# Patient Record
Sex: Female | Born: 1965 | Race: White | Hispanic: No | Marital: Married | Smoking: Never smoker
Health system: Southern US, Community
[De-identification: ages and names within clinical notes are randomized; demographics above are authoritative.]

## PROBLEM LIST (undated history)

## (undated) DIAGNOSIS — T7840XA Allergy, unspecified, initial encounter: Secondary | ICD-10-CM

## (undated) DIAGNOSIS — C50419 Malignant neoplasm of upper-outer quadrant of unspecified female breast: Secondary | ICD-10-CM

## (undated) DIAGNOSIS — F329 Major depressive disorder, single episode, unspecified: Secondary | ICD-10-CM

## (undated) DIAGNOSIS — F419 Anxiety disorder, unspecified: Secondary | ICD-10-CM

## (undated) DIAGNOSIS — F32A Depression, unspecified: Secondary | ICD-10-CM

## (undated) HISTORY — PX: ENDOMETRIAL ABLATION: SHX621

## (undated) HISTORY — DX: Major depressive disorder, single episode, unspecified: F32.9

## (undated) HISTORY — DX: Malignant neoplasm of upper-outer quadrant of unspecified female breast: C50.419

## (undated) HISTORY — DX: Allergy, unspecified, initial encounter: T78.40XA

## (undated) HISTORY — PX: OTHER SURGICAL HISTORY: SHX169

## (undated) HISTORY — DX: Anxiety disorder, unspecified: F41.9

## (undated) HISTORY — DX: Depression, unspecified: F32.A

---

## 1992-03-04 HISTORY — PX: TUBAL LIGATION: SHX77

## 1999-02-08 ENCOUNTER — Other Ambulatory Visit: Admission: RE | Admit: 1999-02-08 | Discharge: 1999-02-08 | Payer: Self-pay | Admitting: *Deleted

## 2000-01-18 ENCOUNTER — Other Ambulatory Visit: Admission: RE | Admit: 2000-01-18 | Discharge: 2000-01-18 | Payer: Self-pay | Admitting: *Deleted

## 2000-12-19 ENCOUNTER — Other Ambulatory Visit: Admission: RE | Admit: 2000-12-19 | Discharge: 2000-12-19 | Payer: Self-pay | Admitting: *Deleted

## 2001-01-02 ENCOUNTER — Other Ambulatory Visit: Admission: RE | Admit: 2001-01-02 | Discharge: 2001-01-02 | Payer: Self-pay | Admitting: *Deleted

## 2001-07-30 ENCOUNTER — Other Ambulatory Visit: Admission: RE | Admit: 2001-07-30 | Discharge: 2001-07-30 | Payer: Self-pay | Admitting: *Deleted

## 2002-08-20 ENCOUNTER — Other Ambulatory Visit: Admission: RE | Admit: 2002-08-20 | Discharge: 2002-08-20 | Payer: Self-pay | Admitting: *Deleted

## 2003-07-15 ENCOUNTER — Other Ambulatory Visit: Admission: RE | Admit: 2003-07-15 | Discharge: 2003-07-15 | Payer: Self-pay | Admitting: *Deleted

## 2004-08-01 ENCOUNTER — Emergency Department: Payer: Self-pay | Admitting: Emergency Medicine

## 2006-12-02 ENCOUNTER — Ambulatory Visit: Payer: Self-pay | Admitting: Family Medicine

## 2007-12-17 ENCOUNTER — Ambulatory Visit: Payer: Self-pay | Admitting: Family Medicine

## 2008-06-09 ENCOUNTER — Ambulatory Visit: Payer: Self-pay | Admitting: Family Medicine

## 2009-05-31 ENCOUNTER — Ambulatory Visit: Payer: Self-pay | Admitting: Unknown Physician Specialty

## 2009-06-07 ENCOUNTER — Ambulatory Visit: Payer: Self-pay | Admitting: Unknown Physician Specialty

## 2010-03-04 HISTORY — PX: OTHER SURGICAL HISTORY: SHX169

## 2010-06-05 ENCOUNTER — Ambulatory Visit: Payer: Self-pay | Admitting: Unknown Physician Specialty

## 2011-11-03 HISTORY — PX: NASAL SINUS SURGERY: SHX719

## 2011-11-28 ENCOUNTER — Ambulatory Visit: Payer: Self-pay | Admitting: Otolaryngology

## 2012-02-02 HISTORY — PX: ABDOMINAL HYSTERECTOMY: SHX81

## 2012-02-04 ENCOUNTER — Ambulatory Visit: Payer: Self-pay | Admitting: Obstetrics and Gynecology

## 2012-02-04 LAB — BASIC METABOLIC PANEL
Anion Gap: 5 — ABNORMAL LOW (ref 7–16)
BUN: 12 mg/dL (ref 7–18)
Creatinine: 0.86 mg/dL (ref 0.60–1.30)
EGFR (African American): >60
Osmolality: 275 (ref 275–301)

## 2012-02-04 LAB — CBC
MCHC: 33.5 g/dL (ref 32.0–36.0)
RDW: 13.5 % (ref 11.5–14.5)
WBC: 6.2 10*3/uL (ref 3.6–11.0)

## 2012-02-14 ENCOUNTER — Ambulatory Visit: Payer: Self-pay | Admitting: Obstetrics and Gynecology

## 2012-02-15 LAB — BASIC METABOLIC PANEL
BUN: 11 mg/dL (ref 7–18)
Calcium, Total: 8.9 mg/dL (ref 8.5–10.1)
EGFR (African American): >60
EGFR (Non-African Amer.): >60
Glucose: 137 mg/dL — ABNORMAL HIGH (ref 65–99)
Osmolality: 283 (ref 275–301)

## 2012-02-15 LAB — HEMATOCRIT: HCT: 37.3 % (ref 35.0–47.0)

## 2012-08-12 ENCOUNTER — Ambulatory Visit: Payer: Self-pay | Admitting: Obstetrics and Gynecology

## 2013-02-11 ENCOUNTER — Ambulatory Visit: Payer: Self-pay | Admitting: Obstetrics and Gynecology

## 2013-03-04 HISTORY — PX: BREAST LUMPECTOMY: SHX2

## 2013-08-06 ENCOUNTER — Ambulatory Visit: Payer: Self-pay | Admitting: Obstetrics and Gynecology

## 2013-08-11 ENCOUNTER — Ambulatory Visit (INDEPENDENT_AMBULATORY_CARE_PROVIDER_SITE_OTHER): Payer: 59 | Admitting: General Surgery

## 2013-08-11 ENCOUNTER — Encounter: Payer: Self-pay | Admitting: General Surgery

## 2013-08-11 VITALS — BP 118/76 | HR 74 | Resp 12 | Ht 63.0 in | Wt 170.0 lb

## 2013-08-11 DIAGNOSIS — R920 Mammographic microcalcification found on diagnostic imaging of breast: Secondary | ICD-10-CM

## 2013-08-11 NOTE — Progress Notes (Signed)
Patient ID: Megan Bass, female   DOB: August 15, 1965, 48 y.o.   MRN: [id number]  Chief Complaint  Patient presents with  . Other    mammogram    HPI Megan Bass is a 48 y.o. female who presents for a breast evaluation. The most recent mammogram was done on 08/06/13. Patient does perform regular self breast checks and gets regular mammograms done. The patient denies any new problems with the breasts at this time. She has had no prior breast surgeries and no known family history of breast problems. Patient states she has been followed every 6 months for a left breast tenderness. No injuries to the breasts.   HPI  Past Medical History  Diagnosis Date  . Allergy   . Anxiety     Past Surgical History  Procedure Laterality Date  . Abdominal hysterectomy  02/2012  . Nasal sinus surgery  11/2011  . Ingrown toe nail  2012    History reviewed. No pertinent family history.  Social History History  Substance Use Topics  . Smoking status: Never Smoker   . Smokeless tobacco: Not on file  . Alcohol Use: No    Allergies  Allergen Reactions  . Iodides Itching  . Sulfa Antibiotics Rash    Current Outpatient Prescriptions  Medication Sig Dispense Refill  . Ascorbic Acid (VITAMIN C) 100 MG tablet Take 100 mg by mouth daily.      . cetirizine (ZYRTEC) 10 MG tablet Take 10 mg by mouth daily.      . cholecalciferol (VITAMIN D) 1000 UNITS tablet Take 1,000 Units by mouth daily.      . citalopram (CELEXA) 20 MG tablet Take 1 tablet by mouth daily.      . mometasone (NASONEX) 50 MCG/ACT nasal spray Place 2 sprays into the nose daily.       No current facility-administered medications for this visit.    Review of Systems Review of Systems  Constitutional: Negative.   Respiratory: Negative.   Cardiovascular: Negative.     Blood pressure 118/76, pulse 74, resp. rate 12, height 5\' 3"  (1.6 m), weight 170 lb (77.111 kg).  Physical Exam Physical Exam  Constitutional: She is  oriented to person, place, and time. She appears well-developed and well-nourished.  Neck: Neck supple. No thyromegaly present.  Cardiovascular: Normal rate, regular rhythm and normal heart sounds.   Pulmonary/Chest: Effort normal and breath sounds normal. Right breast exhibits no inverted nipple, no mass, no nipple discharge, no skin change and no tenderness. Left breast exhibits no inverted nipple, no mass, no nipple discharge, no skin change and no tenderness.  Lymphadenopathy:    She has no cervical adenopathy.    She has no axillary adenopathy.  Neurological: She is alert and oriented to person, place, and time.  Skin: Skin is warm and dry.    Data Reviewed 08/06/2013 mammograms and left breast ultrasound were reviewed. 1 symmetric asymmetry in the left breast, unchanged over the past year. Ultrasound showed multiple cysts.  ) Showed a new area of microcalcifications in the 9-10:00 position measuring 5 mm in maximum diameter. Calcium/fibrocystic calcifications were noted throughout the upper-outer quadrant.  BI-RADS 4 exam based on the new cluster of microcalcifications in the right breast.  Stable left breast cysts.  Assessment    Interval change in the right breast mammogram with new cluster of microcalcifications. No history of trauma.    Plan    Options for management include six-month followup versus early stereotactic biopsy. Pros and cons of both  approaches were reviewed. The patient desires to proceed to biopsy.    Patient has been scheduled for a Right breast stereotactic biopsy at [facility] for 08/23/13 at 1 PM. She will check-in at the [facility] at 12:30 PM. This patient is aware of date, time, and instructions. Patient verbalizes understanding.  PCP: Margarita Rana Ref. Dr. Sandi Mariscal, [place] Gleason 08/12/2013, 7:12 AM

## 2013-08-11 NOTE — Patient Instructions (Addendum)
Patient to be scheduled for right breast stereotactic biopsy.  The patient is aware to call back for any questions or concerns.  Stereotactic Breast Biopsy  A stereotactic breast biopsy is a procedure in which mammography is used in the collection of a sample of breast tissue. Mammography is a type of X-ray exam of the breasts that produces an image called a mammogram. The mammogram allows your health care provider to precisely locate the area of the breast from which a tissue sample will be taken. The tissue is then examined under a microscope to see if cancerous cells are present. A breast biopsy is done when:   A lump, abnormality, or mass is seen in the breast on a breast X-ray (mammogram).   Small calcium deposits (calcifications) are seen in the breast.   The shape or appearance of the breasts changes.   The shape or appearance of the nipples changes. You may have unusual or bloody discharge coming from the nipples, or you may have crusting, retraction, or dimpling of the nipples. A breast biopsy can indicate if you need surgery or other treatment.  LET [facility] CARE PROVIDER KNOW ABOUT:  Any allergies you have.  All medicines you are taking, including vitamins, herbs, eye drops, creams, and over-the-counter medicines.  Previous problems you or members of your family have had with the use of anesthetics.  Any blood disorders you have.  Previous surgeries you have had.  Medical conditions you have. RISKS AND COMPLICATIONS Generally, stereotactic breast biopsy is a safe procedure. However, as with any procedure, complications can occur. Possible complications include:  Infection at the needle-insertion site.   Bleeding or bruising after surgery.  The breast may become altered or deformed as a result of the procedure.  The needle may go through the chest wall into the lung area.  BEFORE THE PROCEDURE  Wear a supportive bra to the procedure.  You will be asked to  remove jewelry, dentures, eyeglasses, metal objects, or clothing that might interfere with the X-ray images. You may want to leave some of these objects at home.  Arrange for someone to drive you home after the procedure if desired. PROCEDURE  A stereotactic breast biopsy is done while you are awake. During the procedure, relax as much as possible. Let your health care provider know if you are uncomfortable, anxious, or in pain. Usually, the only discomfort felt during the procedure is caused by staying in one position for the length of the procedure. This discomfort can be reduced by carefully placed cushions. Most of the time the biopsy is done using a table with openings on it. You will be asked to lie facedown on the table and place your breasts through the openings. Your breast is compressed between metal plates to get good X-ray images. Your skin will be cleaned, and a numbing medicine (local anesthetic) will be injected. A small cut (incision) will be made in your breast. The tip of the biopsy needle will be directed through the incision. Several small pieces of suspicious tissue will be taken. Then, a final set of X-ray images will be obtained. If they show that the suspicious tissue has been mostly or completely removed, a small clip will be left at the biopsy site. This is done so that the biopsy site can be easily located if the results of the biopsy show that the tissue is cancerous.  After the procedure, the incision will be stitched (sutured) or taped and covered with a bandage (dressing).  Your health care provider may apply a pressure dressing and an ice pack to prevent bleeding and swelling in the breast.  A stereotactic breast biopsy can take 30 minutes or more. AFTER THE PROCEDURE  If you are doing well and have no problems, you will be allowed to go home.  Document Released: 11/17/2002 Document Revised: 10/21/2012 Document Reviewed: 09/17/2012 [facility] Patient Information 2014  [place], [place].  Patient has been scheduled for a Right breast stereotactic biopsy at [facility] for 08/23/13 at 1 PM. [facility] will [facility] at [facility] at 12:30 PM. This patient is aware of date, time, and instructions. Patient verbalizes understanding.

## 2013-08-12 DIAGNOSIS — R920 Mammographic microcalcification found on diagnostic imaging of breast: Secondary | ICD-10-CM | POA: Insufficient documentation

## 2013-08-23 ENCOUNTER — Ambulatory Visit: Payer: Self-pay | Admitting: General Surgery

## 2013-08-23 DIAGNOSIS — R920 Mammographic microcalcification found on diagnostic imaging of breast: Secondary | ICD-10-CM

## 2013-08-23 HISTORY — PX: BREAST BIOPSY: SHX20

## 2013-08-25 ENCOUNTER — Telehealth: Payer: Self-pay | Admitting: General Surgery

## 2013-08-25 LAB — PATHOLOGY REPORT

## 2013-08-25 NOTE — Telephone Encounter (Signed)
Notified of biopsy results from the right breast stereo biopsy completed on August 23, 2013. With the findings of LCIS, bilateral MRI will be indicated to exclude secondary lesions. Offered OV to review, but patient wants to wait until MRI completed.

## 2013-08-26 ENCOUNTER — Telehealth: Payer: Self-pay | Admitting: *Deleted

## 2013-08-26 ENCOUNTER — Encounter: Payer: Self-pay | Admitting: General Surgery

## 2013-08-26 ENCOUNTER — Telehealth: Payer: Self-pay | Admitting: General Surgery

## 2013-08-26 DIAGNOSIS — C50919 Malignant neoplasm of unspecified site of unspecified female breast: Secondary | ICD-10-CM

## 2013-08-26 NOTE — Telephone Encounter (Signed)
Patient to come in for discussion on 09-02-13 at 4:30 pm. She is aware of instructions and verbalizes understanding.

## 2013-08-26 NOTE — Telephone Encounter (Signed)
Message copied by Dominga Ferry on Thu Aug 26, 2013  9:13 AM ------      Message from: [place], Forest Gleason      Created: Wed Aug 25, 2013  5:51 PM       Please schedule a bilateral breast MRI in [place] re: LCIS of the right breast. Thanks. Appt to follow 1-2 days after the study.  ------

## 2013-08-26 NOTE — Telephone Encounter (Signed)
Message left for patient on cell and work number for patient to call the office.   The [facility] has scheduled patient for an MRI on 09-01-13. We need to see if patient can come in for an office discussion on 09-02-13 with Dr. Bary Castilla.

## 2013-08-26 NOTE — Telephone Encounter (Signed)
I wanted the patient to be aware that LCIS is not breast cancer in the event it was discussed at the time of her MRI.

## 2013-08-26 NOTE — Telephone Encounter (Signed)
Message left for Megan Bass at the [facility] that patient needs bilateral breast MRI.

## 2013-08-30 ENCOUNTER — Ambulatory Visit: Payer: 59

## 2013-09-01 ENCOUNTER — Ambulatory Visit
Admission: RE | Admit: 2013-09-01 | Discharge: 2013-09-01 | Disposition: A | Discharge status: Home / Self Care | Payer: 59 | Source: Ambulatory Visit | Attending: General Surgery | Admitting: General Surgery

## 2013-09-01 DIAGNOSIS — C50919 Malignant neoplasm of unspecified site of unspecified female breast: Secondary | ICD-10-CM

## 2013-09-01 MED ORDER — GADOBENATE DIMEGLUMINE 529 MG/ML IV SOLN
15.0000 mL | Freq: Once | INTRAVENOUS | Status: AC | PRN
  Administered 2013-09-01: 15 mL via INTRAVENOUS

## 2013-09-02 ENCOUNTER — Encounter: Payer: Self-pay | Admitting: General Surgery

## 2013-09-02 ENCOUNTER — Ambulatory Visit (INDEPENDENT_AMBULATORY_CARE_PROVIDER_SITE_OTHER): Payer: 59 | Admitting: General Surgery

## 2013-09-02 VITALS — BP 124/76 | HR 76 | Resp 12 | Ht 63.0 in | Wt 164.0 lb

## 2013-09-02 DIAGNOSIS — C50919 Malignant neoplasm of unspecified site of unspecified female breast: Secondary | ICD-10-CM

## 2013-09-02 DIAGNOSIS — D0590 Unspecified type of carcinoma in situ of unspecified breast: Secondary | ICD-10-CM

## 2013-09-02 DIAGNOSIS — D0501 Lobular carcinoma in situ of right breast: Secondary | ICD-10-CM

## 2013-09-02 DIAGNOSIS — D0500 Lobular carcinoma in situ of unspecified breast: Secondary | ICD-10-CM | POA: Insufficient documentation

## 2013-09-02 NOTE — Progress Notes (Signed)
Patient ID: Megan Bass, female   DOB: 1965/11/09, 48 y.o.   MRN: [id number]  Chief Complaint  Patient presents with  . Follow-up    discuss MRI     HPI Megan Bass is a 48 y.o. female here today for her follow up MRI done 09/01/13. Patient here today for follow up post right  breast biopsy.  Aware of pathology. The patient is accompanied by her husband as well as her daughter Megan Bass. HPI  Past Medical History  Diagnosis Date  . Allergy   . Anxiety     Past Surgical History  Procedure Laterality Date  . Abdominal hysterectomy  02/2012  . Nasal sinus surgery  11/2011  . Ingrown toe nail  2012    No family history on file.  Social History History  Substance Use Topics  . Smoking status: Never Smoker   . Smokeless tobacco: Not on file  . Alcohol Use: No    Allergies  Allergen Reactions  . Iodides Itching  . Sulfa Antibiotics Rash    Current Outpatient Prescriptions  Medication Sig Dispense Refill  . Ascorbic Acid (VITAMIN C) 100 MG tablet Take 100 mg by mouth daily.      . cetirizine (ZYRTEC) 10 MG tablet Take 10 mg by mouth daily.      . cholecalciferol (VITAMIN D) 1000 UNITS tablet Take 1,000 Units by mouth daily.      . citalopram (CELEXA) 20 MG tablet Take 1 tablet by mouth daily.      . mometasone (NASONEX) 50 MCG/ACT nasal spray Place 2 sprays into the nose daily.       No current facility-administered medications for this visit.    Review of Systems Review of Systems  Constitutional: Negative.   Respiratory: Negative.   Cardiovascular: Negative.     Blood pressure 124/76, pulse 76, resp. rate 12, height 5\' 3"  (1.6 m), weight 164 lb (74.39 kg).  Physical Exam Physical Exam  Data Reviewed Pathology on the right breast biopsy specimen showed evidence of LCIS as well as ADH.  Her recently completed bilateral breast MRI showed the left breast the unremarkable. The right breast had a focal area of uptake in the lower inner quadrant that  will require MRI guided biopsy.  Assessment    LCIS right breast. Focal abnormality on right breast MRI.     Plan    The patient had previously been notified that LCIS as a marker for the potential void and disease in either breast, but in of itself does not require additional treatment. As ADH was also identified in the breast specimen from her recent stereotactic biopsy, reexcision of this area warranted. This will be postponed until after the MRI guided biopsy of the lower inner quadrant right breast abnormality is completed.    Arrangements will be made for the patient to have an MR guided biopsy of the right breast at [facility].     PCP: Etheleen Mayhew 09/02/2013, 8:04 PM

## 2013-09-02 NOTE — Patient Instructions (Signed)
Arrangements will be made for the patient to have an MR guided biopsy of the right breast at [place].

## 2013-09-06 ENCOUNTER — Telehealth: Payer: Self-pay | Admitting: *Deleted

## 2013-09-06 ENCOUNTER — Other Ambulatory Visit: Payer: Self-pay | Admitting: General Surgery

## 2013-09-06 DIAGNOSIS — C50919 Malignant neoplasm of unspecified site of unspecified female breast: Secondary | ICD-10-CM

## 2013-09-06 NOTE — Telephone Encounter (Signed)
Patient has been scheduled for an MR guided biopsy of the right breast for 09-10-13 at [facility].

## 2013-09-10 ENCOUNTER — Encounter (INDEPENDENT_AMBULATORY_CARE_PROVIDER_SITE_OTHER): Payer: Self-pay

## 2013-09-10 ENCOUNTER — Ambulatory Visit
Admission: RE | Admit: 2013-09-10 | Discharge: 2013-09-10 | Disposition: A | Discharge status: Home / Self Care | Payer: 59 | Source: Ambulatory Visit | Attending: General Surgery | Admitting: General Surgery

## 2013-09-10 DIAGNOSIS — C50919 Malignant neoplasm of unspecified site of unspecified female breast: Secondary | ICD-10-CM

## 2013-09-10 HISTORY — PX: BREAST BIOPSY: SHX20

## 2013-09-10 MED ORDER — GADOBENATE DIMEGLUMINE 529 MG/ML IV SOLN
15.0000 mL | Freq: Once | INTRAVENOUS | Status: AC | PRN
  Administered 2013-09-10: 15 mL via INTRAVENOUS

## 2013-09-13 ENCOUNTER — Telehealth: Payer: Self-pay | Admitting: General Surgery

## 2013-09-13 ENCOUNTER — Telehealth: Payer: Self-pay | Admitting: *Deleted

## 2013-09-13 NOTE — Telephone Encounter (Signed)
Reviewed MRI biopsy results of July 10th. (Benign). Reports bleeding post bx day 1.  No hematoma, some bruising.  Plan for wide excision of ADH/ LCIS reviewed. Will arrange OV prior to confirm area remains visible on ultrasound.

## 2013-09-13 NOTE — Telephone Encounter (Signed)
Patient's surgery has been scheduled for 10-14-13 at [facility]. This patient will come to the office for a pre-op visit on 10-06-13 at 4 pm. She is aware of all instructions.

## 2013-10-06 ENCOUNTER — Ambulatory Visit (INDEPENDENT_AMBULATORY_CARE_PROVIDER_SITE_OTHER): Payer: 59 | Admitting: General Surgery

## 2013-10-06 ENCOUNTER — Other Ambulatory Visit: Payer: 59

## 2013-10-06 ENCOUNTER — Encounter: Payer: Self-pay | Admitting: General Surgery

## 2013-10-06 VITALS — BP 130/78 | HR 66 | Resp 12 | Ht 63.0 in | Wt 159.0 lb

## 2013-10-06 DIAGNOSIS — D0501 Lobular carcinoma in situ of right breast: Secondary | ICD-10-CM

## 2013-10-06 DIAGNOSIS — N6089 Other benign mammary dysplasias of unspecified breast: Secondary | ICD-10-CM

## 2013-10-06 DIAGNOSIS — N6091 Unspecified benign mammary dysplasia of right breast: Secondary | ICD-10-CM

## 2013-10-06 DIAGNOSIS — D0590 Unspecified type of carcinoma in situ of unspecified breast: Secondary | ICD-10-CM

## 2013-10-06 DIAGNOSIS — R920 Mammographic microcalcification found on diagnostic imaging of breast: Secondary | ICD-10-CM

## 2013-10-06 NOTE — Patient Instructions (Addendum)
Continue self breast exams. Call office for any new breast issues or concerns.  Patient's surgery has been scheduled for 10-14-13 at [facility].

## 2013-10-06 NOTE — Progress Notes (Signed)
Patient ID: Megan Bass, female   DOB: 08/22/1965, 48 y.o.   MRN: [id number]  Chief Complaint  Patient presents with  . Pre-op Exam    right breast wide excision for 10-14-13    HPI Megan Bass is a 48 y.o. female.  Here today for her preop appointment, right breast wide excision for 10-14-13. No new complaints.   HPI  Past Medical History  Diagnosis Date  . Allergy   . Anxiety     Past Surgical History  Procedure Laterality Date  . Abdominal hysterectomy  02/2012  . Nasal sinus surgery  11/2011  . Ingrown toe nail  2012    No family history on file.  Social History History  Substance Use Topics  . Smoking status: Never Smoker   . Smokeless tobacco: Not on file  . Alcohol Use: No    Allergies  Allergen Reactions  . Iodides Itching  . Sulfa Antibiotics Rash    Current Outpatient Prescriptions  Medication Sig Dispense Refill  . Ascorbic Acid (VITAMIN C) 100 MG tablet Take 100 mg by mouth daily.      . cetirizine (ZYRTEC) 10 MG tablet Take 10 mg by mouth daily.      . cholecalciferol (VITAMIN D) 1000 UNITS tablet Take 1,000 Units by mouth daily.      . citalopram (CELEXA) 20 MG tablet Take 1 tablet by mouth daily.      . mometasone (NASONEX) 50 MCG/ACT nasal spray Place 2 sprays into the nose daily.       No current facility-administered medications for this visit.    Review of Systems Review of Systems  Constitutional: Negative.   Respiratory: Negative.   Cardiovascular: Negative.     Blood pressure 130/78, pulse 66, resp. rate 12, height 5\' 3"  (1.6 m), weight 159 lb (72.122 kg).  Physical Exam Physical Exam  Constitutional: She is oriented to person, place, and time. She appears well-developed and well-nourished.  Neck: Neck supple.  Cardiovascular: Normal rate, regular rhythm and normal heart sounds.   Pulmonary/Chest: Effort normal and breath sounds normal. Right breast exhibits no inverted nipple, no mass, no nipple discharge, no skin  change and no tenderness. Left breast exhibits no inverted nipple, no mass, no nipple discharge, no skin change and no tenderness.  Lymphadenopathy:    She has no cervical adenopathy.    She has no axillary adenopathy.  Neurological: She is alert and oriented to person, place, and time.  Skin: Skin is warm and dry.    Data Reviewed Ultrasound examination of the right breast was completed to determine if the biopsy cavity was evident. This was not the case. She will require needle localization. No images.  Assessment    LCIS, ADH right breast.    Plan    We'll arrange for a wire localization wide excision of the right breast. 2 determine if invasive lobular carcinoma or DCIS is present.  Patient's surgery has been scheduled for 10-14-13 at [facility].     PCP: Margarita Rana Dr. Clayburn Pert  Robert Bellow 10/08/2013, 8:49 AM

## 2013-10-07 ENCOUNTER — Telehealth: Payer: Self-pay | Admitting: *Deleted

## 2013-10-07 NOTE — Telephone Encounter (Signed)
Message left on cell and work numbers for patient to call the office.   Patient will need to arrive at the [facility] day of surgery 10-14-13 at 8:20 am.

## 2013-10-07 NOTE — Telephone Encounter (Signed)
Patient aware of instructions and verbalizes understanding.

## 2013-10-08 ENCOUNTER — Other Ambulatory Visit: Payer: Self-pay | Admitting: General Surgery

## 2013-10-08 DIAGNOSIS — N6091 Unspecified benign mammary dysplasia of right breast: Secondary | ICD-10-CM | POA: Insufficient documentation

## 2013-10-08 DIAGNOSIS — D0501 Lobular carcinoma in situ of right breast: Secondary | ICD-10-CM

## 2013-10-14 ENCOUNTER — Ambulatory Visit: Payer: Self-pay | Admitting: General Surgery

## 2013-10-14 ENCOUNTER — Encounter: Payer: Self-pay | Admitting: General Surgery

## 2013-10-14 DIAGNOSIS — D4860 Neoplasm of uncertain behavior of unspecified breast: Secondary | ICD-10-CM

## 2013-10-14 DIAGNOSIS — C50419 Malignant neoplasm of upper-outer quadrant of unspecified female breast: Secondary | ICD-10-CM

## 2013-10-14 HISTORY — PX: BREAST SURGERY: SHX581

## 2013-10-14 HISTORY — DX: Malignant neoplasm of upper-outer quadrant of unspecified female breast: C50.419

## 2013-10-14 HISTORY — PX: BREAST EXCISIONAL BIOPSY: SUR124

## 2013-10-18 ENCOUNTER — Telehealth: Payer: Self-pay | Admitting: General Surgery

## 2013-10-18 NOTE — Telephone Encounter (Signed)
Notified path only showed LCIS. No upstaging to invasive cancer.  Back at work. Minimal pain well controlled w/ oral meds.  F/U as scheduled on August 20th to discuss further treatment.

## 2013-10-19 ENCOUNTER — Encounter: Payer: Self-pay | Admitting: General Surgery

## 2013-10-21 ENCOUNTER — Encounter: Payer: Self-pay | Admitting: General Surgery

## 2013-10-21 ENCOUNTER — Ambulatory Visit (INDEPENDENT_AMBULATORY_CARE_PROVIDER_SITE_OTHER): Payer: Self-pay | Admitting: General Surgery

## 2013-10-21 VITALS — BP 126/78 | HR 68 | Resp 12 | Ht 63.0 in | Wt 157.0 lb

## 2013-10-21 DIAGNOSIS — D0501 Lobular carcinoma in situ of right breast: Secondary | ICD-10-CM

## 2013-10-21 DIAGNOSIS — D0590 Unspecified type of carcinoma in situ of unspecified breast: Secondary | ICD-10-CM

## 2013-10-21 MED ORDER — TAMOXIFEN CITRATE 20 MG PO TABS: 20.0000 mg | ORAL_TABLET | Freq: Every day | ORAL | Status: DC

## 2013-10-21 NOTE — Patient Instructions (Addendum)
Patient to return one month . Patient to take  Aspirin 81 and calcium 1200 mg daily. Patient to let us know how she is dealing with the tamoxifen at the time of your next visit.

## 2013-10-21 NOTE — Progress Notes (Signed)
Patient ID: Megan Bass, female   DOB: 10/03/1965, 48 y.o.   MRN: [id number]  Chief Complaint  Patient presents with  . Routine Post Op    right breast lumpectomy    HPI Megan Bass is a 48 y.o. female  Here today for her post op right breast lumpectomy done on 10/14/13. Patient states she is doing well.    HPI  Past Medical History  Diagnosis Date  . Allergy   . Anxiety   . Malignant neoplasm of upper-outer quadrant of female breast October 14, 2013    right breast excision ADH./LCIS.     Past Surgical History  Procedure Laterality Date  . Abdominal hysterectomy  02/2012  . Nasal sinus surgery  11/2011  . Ingrown toe nail  2012  . Breast surgery Right 10/14/13    lumpectomy    No family history on file.  Social History History  Substance Use Topics  . Smoking status: Never Smoker   . Smokeless tobacco: Not on file  . Alcohol Use: No    Allergies  Allergen Reactions  . Iodides Itching  . Sulfa Antibiotics Rash    Current Outpatient Prescriptions  Medication Sig Dispense Refill  . Ascorbic Acid (VITAMIN C) 100 MG tablet Take 100 mg by mouth daily.      . cetirizine (ZYRTEC) 10 MG tablet Take 10 mg by mouth daily.      . cholecalciferol (VITAMIN D) 1000 UNITS tablet Take 1,000 Units by mouth daily.      . citalopram (CELEXA) 20 MG tablet Take 1 tablet by mouth daily.      Marland Kitchen HYDROcodone-acetaminophen (NORCO/VICODIN) 5-325 MG per tablet       . mometasone (NASONEX) 50 MCG/ACT nasal spray Place 2 sprays into the nose daily.      . tamoxifen (NOLVADEX) 20 MG tablet Take 1 tablet (20 mg total) by mouth daily.  30 tablet  12   No current facility-administered medications for this visit.    Review of Systems Review of Systems  Constitutional: Negative.   Respiratory: Negative.   Cardiovascular: Negative.     Blood pressure 126/78, pulse 68, resp. rate 12, height 5\' 3"  (1.6 m), weight 157 lb (71.215 kg).  Physical Exam Physical Exam   Constitutional: She is oriented to person, place, and time. She appears well-developed and well-nourished.  Eyes: Conjunctivae are normal.  Neck: Neck supple.  Cardiovascular: Normal rate, regular rhythm and normal heart sounds.   Pulmonary/Chest: Effort normal and breath sounds normal.  Left breast lumpectomy site looks clean and healing well.   Neurological: She is alert and oriented to person, place, and time.  Skin: Skin is warm and dry.    Data Reviewed Biopsies showed ADH. LCIS with negative margins.  Previous MRI unremarkable and the contralateral breast.  Assessment    LCIS/ADH of the right breast.     Plan    The patient is at increased risk for malignancy. Baker Janus model estimate colon 5 years 2%, lifetime 17% risk.  The patient reports her insurance company denied her initial MRI in spite of a biopsy showing LCIS and the high likelihood of multifocal disease. A letter will be sent.  The role for chemoprevention with tamoxifen was discussed. Primary side effects including increased risk of DVT and vasomotor symptoms reviewed. At this time the patient is amenable to a one-month trial. The importance of staying with the medication for one full month anticipating any profound vasomotor symptoms developing in weeks 2-3 will  resolve by that time.  Followup examination in one month.    PCP: Etheleen Mayhew 10/22/2013, 9:21 PM

## 2013-10-22 ENCOUNTER — Encounter: Payer: Self-pay | Admitting: General Surgery

## 2013-10-25 ENCOUNTER — Ambulatory Visit (INDEPENDENT_AMBULATORY_CARE_PROVIDER_SITE_OTHER): Payer: Self-pay | Admitting: *Deleted

## 2013-10-25 VITALS — Ht 63.0 in | Wt 157.0 lb

## 2013-10-25 DIAGNOSIS — D0590 Unspecified type of carcinoma in situ of unspecified breast: Secondary | ICD-10-CM

## 2013-10-25 DIAGNOSIS — D0501 Lobular carcinoma in situ of right breast: Secondary | ICD-10-CM

## 2013-10-25 NOTE — Progress Notes (Signed)
This is a 48 year old female here today for redness and itching at right biopsy site.  Patient to use a benadryl cream for the redness.

## 2013-10-25 NOTE — Patient Instructions (Signed)
Patient to return as scheduled.  

## 2013-11-22 ENCOUNTER — Encounter: Payer: Self-pay | Admitting: General Surgery

## 2013-11-22 ENCOUNTER — Ambulatory Visit (INDEPENDENT_AMBULATORY_CARE_PROVIDER_SITE_OTHER): Payer: Self-pay | Admitting: General Surgery

## 2013-11-22 VITALS — BP 130/76 | HR 78 | Resp 12 | Ht 63.0 in | Wt 159.0 lb

## 2013-11-22 DIAGNOSIS — D0501 Lobular carcinoma in situ of right breast: Secondary | ICD-10-CM

## 2013-11-22 DIAGNOSIS — D0590 Unspecified type of carcinoma in situ of unspecified breast: Secondary | ICD-10-CM

## 2013-11-22 NOTE — Progress Notes (Signed)
Patient ID: Megan Bass, female   DOB: April 23, 1965, 48 y.o.   MRN: [id number]  Chief Complaint  Patient presents with  . Follow-up    1 month f/u lcis    HPI Megan Bass is a 48 y.o. female who presents for a 1 month follow up of LCIS. The patient underwent left breast lumpectomy on 10/14/13.  The patient reports that the hot flashes since institution of tamoxifen therapy has significantly improved.  HPI  Past Medical History  Diagnosis Date  . Allergy   . Anxiety   . Malignant neoplasm of upper-outer quadrant of female breast October 14, 2013    Right breast excision ADH./LCIS.     Past Surgical History  Procedure Laterality Date  . Abdominal hysterectomy  02/2012  . Nasal sinus surgery  11/2011  . Ingrown toe nail  2012  . Breast surgery Right 10/14/13    lumpectomy    History reviewed. No pertinent family history.  Social History History  Substance Use Topics  . Smoking status: Never Smoker   . Smokeless tobacco: Not on file  . Alcohol Use: No    Allergies  Allergen Reactions  . Iodides Itching  . Sulfa Antibiotics Rash    Current Outpatient Prescriptions  Medication Sig Dispense Refill  . Ascorbic Acid (VITAMIN C) 100 MG tablet Take 100 mg by mouth daily.      Marland Kitchen aspirin 81 MG tablet Take 81 mg by mouth daily.      . Calcium Carbonate-Vit D-Min (CALCIUM 1200 PO) Take 1 tablet by mouth daily.      . cetirizine (ZYRTEC) 10 MG tablet Take 10 mg by mouth daily.      . cholecalciferol (VITAMIN D) 1000 UNITS tablet Take 1,000 Units by mouth daily.      . citalopram (CELEXA) 20 MG tablet Take 1 tablet by mouth daily.      Marland Kitchen HYDROcodone-acetaminophen (NORCO/VICODIN) 5-325 MG per tablet       . mometasone (NASONEX) 50 MCG/ACT nasal spray Place 2 sprays into the nose daily.      . tamoxifen (NOLVADEX) 20 MG tablet Take 1 tablet (20 mg total) by mouth daily.  30 tablet  12   No current facility-administered medications for this visit.    Review of  Systems Review of Systems  Blood pressure 130/76, pulse 78, resp. rate 12, height 5\' 3"  (1.6 m), weight 159 lb (72.122 kg).  Physical Exam Physical Exam  Constitutional: She is oriented to person, place, and time. She appears well-developed.  Eyes: Conjunctivae are normal. No scleral icterus.  Cardiovascular: Normal rate, regular rhythm and normal heart sounds.   Pulmonary/Chest: Effort normal and breath sounds normal.  Right breast incision looks clean and healing well./   Neurological: She is alert and oriented to person, place, and time.  Skin: Skin is warm and dry.    Data Reviewed No new data. ER/PR data not available, requested.  Assessment    LCIS, Reasonable tolerance of antiestrogen therapy.     Plan    Will plan for a followup examination 5 months and repeat mammograms in Middle/late summer 2016.     PCP: Margarita Rana  Ref Dr. Clayburn Pert    Robert Bellow 11/22/2013, 9:02 PM

## 2013-11-22 NOTE — Patient Instructions (Signed)
Patient to return in five months 

## 2013-11-26 LAB — PATHOLOGY REPORT

## 2014-01-03 ENCOUNTER — Encounter: Payer: Self-pay | Admitting: General Surgery

## 2014-01-31 ENCOUNTER — Telehealth: Payer: Self-pay

## 2014-01-31 NOTE — Telephone Encounter (Signed)
Patient called and said that she is now having a lot of problems with night sweats. She reports going thru two changes of clothes per night. She spoke with her primary care doctor who suggested either Black Cohosh or Bellergal-S supplements to help with this. She advised her to ask you first about supplements or if you had any other suggestions.

## 2014-02-03 NOTE — Telephone Encounter (Signed)
Discussed night sweats worse, daytime hot flashes tolerable. Getting up BID/TID to change clothes w/ Tamoxifen. Has an RX for compounding of Bellergal S.  Will try for one month, if no better, will change to black cohash. To call here if no improvement with either meds to review advisability of continued chemoprevention.

## 2014-04-26 ENCOUNTER — Ambulatory Visit: Payer: Self-pay | Admitting: General Surgery

## 2014-04-28 ENCOUNTER — Ambulatory Visit: Payer: Self-pay | Admitting: General Surgery

## 2014-05-04 ENCOUNTER — Ambulatory Visit (INDEPENDENT_AMBULATORY_CARE_PROVIDER_SITE_OTHER): Payer: 59 | Admitting: General Surgery

## 2014-05-04 ENCOUNTER — Encounter: Payer: Self-pay | Admitting: General Surgery

## 2014-05-04 VITALS — BP 120/80 | HR 80 | Resp 14 | Ht 63.0 in | Wt 155.0 lb

## 2014-05-04 DIAGNOSIS — M940 Chondrocostal junction syndrome [Tietze]: Secondary | ICD-10-CM

## 2014-05-04 DIAGNOSIS — D0501 Lobular carcinoma in situ of right breast: Secondary | ICD-10-CM

## 2014-05-04 NOTE — Progress Notes (Signed)
Patient ID: Megan Bass, female   DOB: 24-Aug-1965, 49 y.o.   MRN: [id number]  Chief Complaint  Patient presents with  . Follow-up    mammogram    HPI Megan Bass is a 49 y.o. female who presents for a breast cancer follow up. No mammogram at this time. She does states that yesterday she felt like she had a lump in the upper inner quadrant of the left breast. She states she was having pain in this area. Ergotamine has significantly improved 8 sweats.  HPI  Past Medical History  Diagnosis Date  . Allergy   . Anxiety   . Malignant neoplasm of upper-outer quadrant of female breast October 14, 2013    Right breast excision ADH./LCIS.     Past Surgical History  Procedure Laterality Date  . Abdominal hysterectomy  02/2012  . Nasal sinus surgery  11/2011  . Ingrown toe nail  2012  . Breast surgery Right 10/14/13    lumpectomy    History reviewed. No pertinent family history.  Social History History  Substance Use Topics  . Smoking status: Never Smoker   . Smokeless tobacco: Not on file  . Alcohol Use: No    Allergies  Allergen Reactions  . Iodides Itching  . Sulfa Antibiotics Rash    Current Outpatient Prescriptions  Medication Sig Dispense Refill  . Ascorbic Acid (VITAMIN C) 100 MG tablet Take 100 mg by mouth daily.    Marland Kitchen aspirin 81 MG tablet Take 81 mg by mouth daily.    . Calcium Carbonate-Vit D-Min (CALCIUM 1200 PO) Take 1 tablet by mouth daily.    . cetirizine (ZYRTEC) 10 MG tablet Take 10 mg by mouth daily.    . cholecalciferol (VITAMIN D) 1000 UNITS tablet Take 1,000 Units by mouth daily.    . citalopram (CELEXA) 20 MG tablet Take 1 tablet by mouth daily.    . Ergotamine Tartrate POWD Take 1 tablet by mouth 2 (two) times daily.    . mometasone (NASONEX) 50 MCG/ACT nasal spray Place 2 sprays into the nose daily.    . tamoxifen (NOLVADEX) 20 MG tablet Take 1 tablet (20 mg total) by mouth daily. 30 tablet 12   No current facility-administered  medications for this visit.    Review of Systems Review of Systems  Constitutional: Negative.   Respiratory: Negative.   Cardiovascular: Negative.     Blood pressure 120/80, pulse 80, resp. rate 14, height 5\' 3"  (1.6 m), weight 155 lb (70.308 kg).  Physical Exam Physical Exam  Constitutional: She is oriented to person, place, and time. She appears well-developed and well-nourished.  Neck: Neck supple. No thyromegaly present.  Cardiovascular: Normal rate, regular rhythm and normal heart sounds.   No murmur heard. Pulmonary/Chest: Effort normal. Right breast exhibits no inverted nipple, no mass, no nipple discharge, no skin change and no tenderness. Left breast exhibits no inverted nipple, no mass, no nipple discharge, no skin change and no tenderness.    Well healed incision in right upper outer quadrant.   Lymphadenopathy:    She has no cervical adenopathy.    She has no axillary adenopathy.  Neurological: She is alert and oriented to person, place, and time.  Skin: Skin is warm and dry.      Assessment    Benign breast exam, mild costochondritis, resolving.    Plan    The patient should continue her regular exercise program. Follow-up examination with bilateral mammograms in 3 months.    PCP:  Margit Hanks W 05/05/2014, 9:21 PM

## 2014-05-04 NOTE — Patient Instructions (Signed)
Patient to return in 3 months for follow up. Continue self breast exams. Call office for any new breast issues or concerns.

## 2014-05-05 ENCOUNTER — Encounter: Payer: Self-pay | Admitting: General Surgery

## 2014-05-05 DIAGNOSIS — M940 Chondrocostal junction syndrome [Tietze]: Secondary | ICD-10-CM | POA: Insufficient documentation

## 2014-06-21 NOTE — Op Note (Signed)
PATIENT NAME:  Megan Bass, MARQUART MR#:  [id number] DATE OF BIRTH:  [birth year]  DATE OF PROCEDURE:  02/14/2012  PREOPERATIVE DIAGNOSIS: Severe pelvic pain with heavy bleeding unresponsive to medical management.   POSTOPERATIVE DIAGNOSIS: Severe pelvic pain with heavy bleeding unresponsive to medical management.   PROCEDURE PERFORMED: Laparoscopic supracervical hysterectomy with bilateral salpingectomy.   SURGEON: Delsa Sale, MD   ASSISTANT: Prentice Docker, MD  ANESTHESIA: General endotracheal anesthesia.   ESTIMATED BLOOD LOSS: Approximately 50 mL.   FINDINGS AT SURGERY: A globular uterus with normal tubes and ovaries bilaterally.   DESCRIPTION OF PROCEDURE: The patient was taken to the operating room and placed in a supine position. After adequate general endotracheal anesthesia was instilled, the patient was prepped and draped in the usual sterile fashion. Timeout was performed. The patient was placed in [place] and the Foley catheter was found to be placed correctly on her leg. A side-opening speculum was placed in the patient's vagina and the anterior lip of the cervix was grasped with a single-tooth tenaculum. The Hulka tenaculum was placed into the uterus and the side-opening speculum was removed from the patient's vagina. The umbilicus was injected with Marcaine. Incision was made with the scalpel. Veress needle was placed. Hang drop test, fluid instillation test and fluid aspiration test showed proper placement of the Veress needle. The CO2 was placed on low flow. When tympany was heard around the liver, CO2 was placed on high flow. The Veress needle was then removed and the XCEL trocars were placed under direct visualization. The CO2 was allowed to flow into the trocar. The aforementioned findings were seen. A right-sided 5 mm trocar port was placed into the lower abdomen and a 10 mm trocar port was placed on the left side. The Harmonic scalpel was placed to the left lower  quadrant and the left tube was grasped from the opposite side. The Harmonic scalpel was then used to cut through the peritoneum and the broad ligament between the uterus and the ovary. This was carried down to the junction of the utero-ovarian ligament which was also clamped, cut and ligated with the Harmonic scalpel. This was completed first on the left and then on the right. In a similar fashion, the round ligaments were grasped bilaterally first on the right, then on the left, grasped, cut, clamped and cauterized. A bladder flap was created with the edge of the Harmonic scalpel. The bladder flap was pushed off the edge of the cervix. The uterine arteries were skeletonized. Kleppinger was used to grasp and cauterize the uterine arteries in 3 places. The Harmonic scalpel was then used to cut across the cauterized area of the uterine arteries. The Harmonic scalpel was then used to cut across the uterocervical junction and the uterus was released. The Kleppinger was then used to cauterize the os of the cervix. There was one small area of bleeding on the left and this was cauterized again with the Kleppinger. The pelvis was irrigated with copious amounts of warm normal saline. The left lower quadrant trocar port was extended with the scalpel and the morcellator trocar was placed into the skin incision. Attention was then turned to the morcellator and the uterus. The uterus was grasped with the tenaculum through the morcellator and the uterus was pulled through the morcellator. The pelvis was then copiously irrigated. The aforementioned findings were seen. The anterior abdominal wall was intact. There was good hemostasis of the ovaries and the cervix. Photographs were taken. Interceed was placed across  the cervical opening. The patient was taken out of Trendelenburg and laid flat. Good hemostasis was identified. The patient had been given indigo carmine after clamping of the uterine arteries. Blue urine was noted in  the Foley bag. The patient was laid supine and the metal cone was used to close the abdominal incision on the left, UR-6 was then used to close the umbilicus, 4-0 Monocryl was used to approximate the skin edges. The Dermabond was placed. Tegaderm was placed. Hulka tenaculum was removed from the patient's vagina. Good hemostasis was identified. The patient was laid supine and taken to recovery after having tolerated the procedure well.     ____________________________ Delsa Sale, MD cck:es D: 02/19/2012 22:44:49 ET T: 02/20/2012 08:54:21 ET JOB#: [id number]  cc: Delsa Sale, MD, <Dictator> Delsa Sale MD ELECTRONICALLY SIGNED 03/02/2012 0:48

## 2014-06-25 NOTE — Op Note (Signed)
PATIENT NAME:  Megan Bass, Megan Bass MR#:  [id number] DATE OF BIRTH:  Dec 15, 1965  DATE OF PROCEDURE:  10/14/2013  PREOPERATIVE DIAGNOSIS: Lobular carcinoma in situ, atypical ductal hyperplasia of the right breast.   POSTOPERATIVE DIAGNOSIS: Lobular carcinoma in situ, atypical ductal hyperplasia of the right breast.   OPERATIVE PROCEDURE: Wire localized wide excision of the right upper outer quadrant of the right breast.   SURGEON: Dr. Hervey Ard   ANESTHESIA: General by LMA under Dr. Kayleen Memos, Marcaine 0.5% with 1:200,000 units of epinephrine local anesthesia.  This is a 49 year old woman recently underwent a stereotactic biopsy of an abnormal mammogram with findings of LCIS and ADH. Preoperative MRI showed no additional areas. She was felt to be a candidate for wide local excision to determine if there would be upstaging to DCIS.   OPERATIVE NOTE: The patient underwent needle localization by Conchita Paris, M.D., from radiology, with positioning of the tip of the Kopans wire adjacent to the previously placed clip. The patient underwent general anesthesia. The breast was taped medially and ultrasound used to identify the tip of the wire and the breast specimen. The curvilinear incision in the upper outer quadrant was made after instillation of local anesthetic. It was incised sharply and the remaining dissection completed with electrocautery. A 4 x 4 x 4 cm block of tissue was excised and specimen radiograph confirmed the intact tip of the wire and the previously placed clip. The wound was closed in multiple layers of 2-0 Vicryl figure-of-eight sutures. Skin was closed with running 4-0 Vicryl subcuticular suture. Benzoin and Steri-Strips followed by Telfa gauze, fluff, Kerlix and an Ace wrap applied.   The patient tolerated the procedure well and was taken to the recovery room in stable condition.     ____________________________ Robert Bellow, MD jwb:JT D: 10/14/2013 22:19:41  ET T: 10/15/2013 01:10:21 ET JOB#: [id number]  cc: Robert Bellow, MD, <Dictator> Jerrell Belfast, MD Sante Biedermann Amedeo Kinsman MD ELECTRONICALLY SIGNED 10/15/2013 16:38

## 2014-07-13 ENCOUNTER — Other Ambulatory Visit: Payer: Self-pay

## 2014-07-13 DIAGNOSIS — D0501 Lobular carcinoma in situ of right breast: Secondary | ICD-10-CM

## 2014-07-20 ENCOUNTER — Other Ambulatory Visit: Payer: Self-pay

## 2014-07-20 DIAGNOSIS — D0501 Lobular carcinoma in situ of right breast: Secondary | ICD-10-CM

## 2014-08-12 ENCOUNTER — Ambulatory Visit: Payer: Self-pay

## 2014-08-12 ENCOUNTER — Other Ambulatory Visit: Payer: Self-pay | Admitting: General Surgery

## 2014-08-12 ENCOUNTER — Ambulatory Visit
Admission: RE | Admit: 2014-08-12 | Discharge: 2014-08-12 | Disposition: A | Discharge status: Home / Self Care | Payer: 59 | Source: Ambulatory Visit | Attending: General Surgery | Admitting: General Surgery

## 2014-08-12 DIAGNOSIS — D0501 Lobular carcinoma in situ of right breast: Secondary | ICD-10-CM

## 2014-08-16 ENCOUNTER — Other Ambulatory Visit: Payer: Self-pay

## 2014-08-16 DIAGNOSIS — N959 Unspecified menopausal and perimenopausal disorder: Secondary | ICD-10-CM | POA: Insufficient documentation

## 2014-08-18 ENCOUNTER — Encounter: Payer: Self-pay | Admitting: General Surgery

## 2014-08-18 ENCOUNTER — Ambulatory Visit (INDEPENDENT_AMBULATORY_CARE_PROVIDER_SITE_OTHER): Payer: 59 | Admitting: General Surgery

## 2014-08-18 VITALS — BP 142/80 | HR 76 | Resp 14 | Ht 63.0 in | Wt 154.0 lb

## 2014-08-18 DIAGNOSIS — D0501 Lobular carcinoma in situ of right breast: Secondary | ICD-10-CM | POA: Diagnosis not present

## 2014-08-18 NOTE — Patient Instructions (Addendum)
Continue self breast exams. Call office for any new breast issues or concerns. May take aspirin every other day

## 2014-08-18 NOTE — Progress Notes (Signed)
Patient ID: Megan Bass, female   DOB: 1966/01/16, 49 y.o.   MRN: [id number]  Chief Complaint  Patient presents with  . Follow-up    mammogram    HPI Megan Bass is a 49 y.o. female who presents for a breast evaluation. The most recent mammogram and left breast ultrasound was done on 08/12/14. Patient does perform regular self breast checks and gets regular mammograms done.  No new breast issues. Tolerating Tamoxifen with minimal hot flashes. The patient does appreciated marked increased in bruisability since beginning aspirin, 81 mg per day prescribed to minimize her risk of DVT related to antiestrogen therapy.   HPI  Past Medical History  Diagnosis Date  . Allergy   . Anxiety   . Malignant neoplasm of upper-outer quadrant of female breast October 14, 2013    Right breast excision ADH./LCIS.     Past Surgical History  Procedure Laterality Date  . Abdominal hysterectomy  02/2012  . Nasal sinus surgery  11/2011  . Ingrown toe nail  2012  . Breast surgery Right 10/14/13    lumpectomy  . Breast biopsy Right 6.22.15    LOBULAR CARCINOMA IN SITU/ATYPICAL DUCTAL HYPERPLASIA     No family history on file.  Social History History  Substance Use Topics  . Smoking status: Never Smoker   . Smokeless tobacco: Not on file  . Alcohol Use: No    Allergies  Allergen Reactions  . Iodides Itching  . Sulfa Antibiotics Rash    Current Outpatient Prescriptions  Medication Sig Dispense Refill  . Ascorbic Acid (VITAMIN C) 100 MG tablet Take 100 mg by mouth daily.    Marland Kitchen aspirin 81 MG tablet Take 81 mg by mouth daily.    . Calcium Carbonate-Vit D-Min (CALCIUM 1200 PO) Take 1 tablet by mouth daily.    . cetirizine (ZYRTEC) 10 MG tablet Take 10 mg by mouth daily.    . cholecalciferol (VITAMIN D) 1000 UNITS tablet Take 1,000 Units by mouth daily.    . citalopram (CELEXA) 20 MG tablet Take 1 tablet by mouth daily.    . Ergotamine Tartrate POWD Take 1 tablet by mouth 2 (two)  times daily.    . mometasone (NASONEX) 50 MCG/ACT nasal spray Place 2 sprays into the nose daily.    . tamoxifen (NOLVADEX) 20 MG tablet Take 1 tablet (20 mg total) by mouth daily. 30 tablet 12   No current facility-administered medications for this visit.    Review of Systems Review of Systems  Constitutional: Negative.   Respiratory: Negative.   Cardiovascular: Negative.     Blood pressure 142/80, pulse 76, resp. rate 14, height 5\' 3"  (1.6 m), weight 154 lb (69.854 kg).  Physical Exam Physical Exam  Constitutional: She is oriented to person, place, and time. She appears well-developed and well-nourished.  Neck: Neck supple.  Cardiovascular: Normal rate, regular rhythm and normal heart sounds.   Pulmonary/Chest: Effort normal and breath sounds normal. Right breast exhibits no inverted nipple, no mass, no nipple discharge, no skin change and no tenderness. Left breast exhibits no inverted nipple, no mass, no nipple discharge, no skin change and no tenderness.  Well healed incision upper outer quadrant right breast. 10 % volume loss right breast.  Lymphadenopathy:    She has no cervical adenopathy.    She has no axillary adenopathy.  Neurological: She is alert and oriented to person, place, and time.  Skin: Skin is warm and dry.    Data Reviewed 08/12/2014 mammograms and  ultrasound reviewed.  BI-RADS 2.  Assessment    Benign breast exam. Past history LCIS.    Plan    The patient will decrease her pediatric aspirin 2 every other day and see if this results in an return to her baseline bruisability.    May take aspirin every other day. Patient to have a bilateral diagnostic mammogram follow up in one year.  Continue self breast exams. Call office for any new breast issues or concerns.  PCP:  Etheleen Mayhew 08/19/2014, 5:22 PM

## 2014-08-18 NOTE — Progress Notes (Deleted)
Patient ID: Megan Bass, female   DOB: Sep 25, 1965, 49 y.o.   MRN: [id number]  Chief Complaint  Patient presents with  . Follow-up    mammogram    HPI Megan Bass is a 49 y.o. female.  *** HPI  Past Medical History  Diagnosis Date  . Allergy   . Anxiety   . Malignant neoplasm of upper-outer quadrant of female breast October 14, 2013    Right breast excision ADH./LCIS.     Past Surgical History  Procedure Laterality Date  . Abdominal hysterectomy  02/2012  . Nasal sinus surgery  11/2011  . Ingrown toe nail  2012  . Breast surgery Right 10/14/13    lumpectomy  . Breast biopsy Right 6.22.15    LOBULAR CARCINOMA IN SITU/ATYPICAL DUCTAL HYPERPLASIA     No family history on file.  Social History History  Substance Use Topics  . Smoking status: Never Smoker   . Smokeless tobacco: Not on file  . Alcohol Use: No    Allergies  Allergen Reactions  . Iodides Itching  . Sulfa Antibiotics Rash    Current Outpatient Prescriptions  Medication Sig Dispense Refill  . Ascorbic Acid (VITAMIN C) 100 MG tablet Take 100 mg by mouth daily.    Marland Kitchen aspirin 81 MG tablet Take 81 mg by mouth daily.    . Calcium Carbonate-Vit D-Min (CALCIUM 1200 PO) Take 1 tablet by mouth daily.    . cetirizine (ZYRTEC) 10 MG tablet Take 10 mg by mouth daily.    . cholecalciferol (VITAMIN D) 1000 UNITS tablet Take 1,000 Units by mouth daily.    . citalopram (CELEXA) 20 MG tablet Take 1 tablet by mouth daily.    . Ergotamine Tartrate POWD Take 1 tablet by mouth 2 (two) times daily.    . mometasone (NASONEX) 50 MCG/ACT nasal spray Place 2 sprays into the nose daily.    . tamoxifen (NOLVADEX) 20 MG tablet Take 1 tablet (20 mg total) by mouth daily. 30 tablet 12   No current facility-administered medications for this visit.    Review of Systems Review of Systems  Constitutional: Negative.   HENT: Negative.   Eyes: Negative.   Respiratory: Negative.   Cardiovascular: Negative.    Gastrointestinal: Negative.   Endocrine: Negative.   Genitourinary: Negative.   Skin: Negative.   Allergic/Immunologic: Negative.   Neurological: Negative.     Blood pressure 142/80, pulse 76, resp. rate 14, height 5\' 3"  (1.6 m), weight 154 lb (69.854 kg).  Physical Exam Physical Exam  Data Reviewed ***  Assessment    ***    Plan    ***       Robert Bellow 08/18/2014, 9:58 AM

## 2014-11-23 ENCOUNTER — Other Ambulatory Visit: Payer: Self-pay | Admitting: General Surgery

## 2015-03-09 ENCOUNTER — Other Ambulatory Visit: Payer: Self-pay

## 2015-06-01 ENCOUNTER — Other Ambulatory Visit: Payer: Self-pay | Admitting: *Deleted

## 2015-06-01 DIAGNOSIS — D0501 Lobular carcinoma in situ of right breast: Secondary | ICD-10-CM

## 2015-06-01 DIAGNOSIS — Z1231 Encounter for screening mammogram for malignant neoplasm of breast: Secondary | ICD-10-CM

## 2015-06-06 ENCOUNTER — Encounter: Payer: Self-pay | Admitting: *Deleted

## 2015-06-12 ENCOUNTER — Other Ambulatory Visit: Payer: Self-pay | Admitting: *Deleted

## 2015-06-12 NOTE — Addendum Note (Signed)
Addended by: Verlene Mayer A on: 06/12/2015 02:22 PM   Modules accepted: Orders

## 2015-08-08 ENCOUNTER — Ambulatory Visit (INDEPENDENT_AMBULATORY_CARE_PROVIDER_SITE_OTHER): Payer: 59 | Admitting: Family Medicine

## 2015-08-08 ENCOUNTER — Ambulatory Visit
Admission: RE | Admit: 2015-08-08 | Discharge: 2015-08-08 | Disposition: A | Discharge status: Home / Self Care | Payer: 59 | Source: Ambulatory Visit | Attending: Family Medicine | Admitting: Family Medicine

## 2015-08-08 ENCOUNTER — Encounter: Payer: Self-pay | Admitting: Family Medicine

## 2015-08-08 VITALS — BP 132/84 | HR 74 | Temp 98.6°F | Resp 16 | Wt 162.6 lb

## 2015-08-08 DIAGNOSIS — M7742 Metatarsalgia, left foot: Secondary | ICD-10-CM

## 2015-08-08 DIAGNOSIS — J019 Acute sinusitis, unspecified: Secondary | ICD-10-CM | POA: Diagnosis not present

## 2015-08-08 MED ORDER — AMOXICILLIN-POT CLAVULANATE 875-125 MG PO TABS: 1.0000 | ORAL_TABLET | Freq: Two times a day (BID) | ORAL | Status: DC

## 2015-08-08 NOTE — Progress Notes (Signed)
Subjective:     Patient ID: Megan Bass, female   DOB: 1965/06/05, 50 y.o.   MRN: [id number]  HPI  Chief Complaint  Patient presents with  . Dysphagia    Patient comes in office today with concerns of difficutly swallowing for the past 24hrs. Patient reports that she has noticed swelling of her right tonsil but denies sore throat, patient admits that she has been dealing with sinus drinage and allergies for thew past week. Patient has been taking otc allergy medication and nasal spray.   . Foot Pain    Patient reports left foot pain for the past 24 hrs, patient states that she is an active walker and now has pain when walking or bearing weight on foot.   States she developed sore throat a week ago and then developed purulent sinus drainage with PND. This has not abated with current dysphagia symptoms. Reports difficulty swallowing both liquids and solids but on choking. Denies hx of heartburn or reflux but has tried a Prilosec for her sx. States she is concerned about a bony foot problem as she is on tamoxifen for breast cancer. Denies specific injury: "It hurt to walk my dogs yesterday."   Review of Systems     Objective:   Physical Exam  Constitutional: She appears well-developed.  Ears: T.M's intact without inflammation Throat: mild right tonsillar enlargement without erythema or exudate. Neck: no cervical adenopathy, thyromegaly or tenderness Extremities: left foot tender over her first mid metatarsal. DF/PF 5/5 without pain. Ankle and foot ligaments stable.     Assessment:    1. Acute sinusitis, recurrence not specified, unspecified location - amoxicillin-clavulanate (AUGMENTIN) 875-125 MG tablet; Take 1 tablet by mouth 2 (two) times daily.  Dispense: 20 tablet; Refill: 0  2. Metatarsalgia of left foot - DG Foot Complete Left; Future    Plan:    Further f/u pending x-ray report.

## 2015-08-08 NOTE — Patient Instructions (Signed)
Add Mucinex D. We will call you with the x-ray result.

## 2015-08-09 ENCOUNTER — Telehealth: Payer: Self-pay

## 2015-08-09 NOTE — Telephone Encounter (Signed)
-----   Message from Carmon Ginsberg, [place] sent at 08/08/2015  9:31 AM EDT ----- X-ray ok. Try two Aleve twice daily with food and cold compresses for 20 minutes 2-3 x day for two days.

## 2015-08-09 NOTE — Telephone Encounter (Signed)
Patient has been advised. KW 

## 2015-08-14 ENCOUNTER — Other Ambulatory Visit: Payer: Self-pay | Admitting: General Surgery

## 2015-08-14 ENCOUNTER — Ambulatory Visit
Admission: RE | Admit: 2015-08-14 | Discharge: 2015-08-14 | Disposition: A | Discharge status: Home / Self Care | Payer: 59 | Source: Ambulatory Visit | Attending: General Surgery | Admitting: General Surgery

## 2015-08-14 DIAGNOSIS — R921 Mammographic calcification found on diagnostic imaging of breast: Secondary | ICD-10-CM | POA: Insufficient documentation

## 2015-08-14 DIAGNOSIS — D0501 Lobular carcinoma in situ of right breast: Secondary | ICD-10-CM

## 2015-08-14 DIAGNOSIS — Z853 Personal history of malignant neoplasm of breast: Secondary | ICD-10-CM | POA: Insufficient documentation

## 2015-08-22 ENCOUNTER — Ambulatory Visit (INDEPENDENT_AMBULATORY_CARE_PROVIDER_SITE_OTHER): Payer: 59 | Admitting: General Surgery

## 2015-08-22 ENCOUNTER — Encounter: Payer: Self-pay | Admitting: General Surgery

## 2015-08-22 VITALS — BP 144/78 | HR 74 | Resp 12 | Ht 63.0 in | Wt 161.0 lb

## 2015-08-22 DIAGNOSIS — N952 Postmenopausal atrophic vaginitis: Secondary | ICD-10-CM

## 2015-08-22 DIAGNOSIS — D0501 Lobular carcinoma in situ of right breast: Secondary | ICD-10-CM | POA: Diagnosis not present

## 2015-08-22 MED ORDER — ESTRADIOL 0.1 MG/GM VA CREA: TOPICAL_CREAM | VAGINAL | Status: DC

## 2015-08-22 NOTE — Patient Instructions (Signed)
The patient has been asked to return to the office in one year with a bilateral diagnostic mammogram. 

## 2015-08-22 NOTE — Progress Notes (Signed)
Patient ID: Megan Bass, female   DOB: 1965/10/28, 50 y.o.   MRN: [id number]  Chief Complaint  Patient presents with  . Follow-up    mammgram    HPI Megan Bass is a 50 y.o. female who presents for a breast evaluation. The most recent mammogram was done on 08/14/15 . Patient does perform regular self breast checks and gets regular mammograms done.Tolerating Tamoxifen with minimal hot flashes   The patient has noted significant difficulty with vaginal dryness and dysparunia unresponsive to topical lubricants. She also recently was treated with a course of antibiotics and had her first vaginal yeast infection and decades.    HPI  Past Medical History  Diagnosis Date  . Allergy   . Anxiety   . Malignant neoplasm of upper-outer quadrant of female breast [facility]) October 14, 2013    Right breast excision ADH./LCIS.     Past Surgical History  Procedure Laterality Date  . Abdominal hysterectomy  02/2012  . Nasal sinus surgery  11/2011  . Ingrown toe nail  2012  . Breast surgery Right 10/14/13    lumpectomy  . Breast biopsy Right 6.22.15    LOBULAR CARCINOMA IN SITU/ATYPICAL DUCTAL HYPERPLASIA     No family history on file.  Social History Social History  Substance Use Topics  . Smoking status: Never Smoker   . Smokeless tobacco: None  . Alcohol Use: No    Allergies  Allergen Reactions  . Iodides Itching  . Sulfa Antibiotics Rash    Current Outpatient Prescriptions  Medication Sig Dispense Refill  . aspirin 81 MG tablet Take 81 mg by mouth daily.    . Calcium Carbonate-Vit D-Min (CALCIUM 1200 PO) Take 1 tablet by mouth daily.    . cetirizine (ZYRTEC) 10 MG tablet Take 10 mg by mouth daily.    . cholecalciferol (VITAMIN D) 1000 UNITS tablet Take 1,000 Units by mouth daily.    . citalopram (CELEXA) 20 MG tablet Take 1 tablet by mouth daily.    . Ergotamine Tartrate POWD Take 1 tablet by mouth 2 (two) times daily.    . Ergotamine-PB-Belladonna (BEL/PHEN/ERGOT SR  PO) Take by mouth.    . mometasone (NASONEX) 50 MCG/ACT nasal spray Place 2 sprays into the nose daily.    . tamoxifen (NOLVADEX) 20 MG tablet Take 1 tablet by mouth  every day 90 tablet 3  . estradiol (ESTRACE VAGINAL) 0.1 MG/GM vaginal cream Apply vaginally one applicator full daily for one week, then twice a week as needed for comfort. 42.5 g 12   No current facility-administered medications for this visit.    Review of Systems Review of Systems  Constitutional: Negative.   Respiratory: Negative.   Cardiovascular: Negative.     Blood pressure 144/78, pulse 74, resp. rate 12, height 5\' 3"  (1.6 m), weight 161 lb (73.029 kg).  Physical Exam Physical Exam  Constitutional: She is oriented to person, place, and time. She appears well-developed and well-nourished.  Eyes: Conjunctivae are normal. No scleral icterus.  Neck: Neck supple.  Cardiovascular: Normal rate, regular rhythm and normal heart sounds.   Pulmonary/Chest: Effort normal and breath sounds normal. Right breast exhibits no inverted nipple, no mass, no nipple discharge, no skin change and no tenderness. Left breast exhibits no inverted nipple, no mass, no nipple discharge, no skin change and no tenderness.    Well healed incision upper outer quadrant right breast. 10 % volume loss right breast.   Abdominal: Soft. Bowel sounds are normal. There is no  tenderness.  Lymphadenopathy:    She has no cervical adenopathy.  Neurological: She is alert and oriented to person, place, and time.  Skin: Skin is warm and dry.    Data Reviewed Bilateral mammograms dated 08/14/2015 were reviewed. Postsurgical changes. BI-RADS-2.  Assessment    Tolerating tamoxifen therapy well except for vaginal dryness. Compounded Bellergal S beneficial for vasomotor symptoms.  Decreased bruisability with decrease in pediatric aspirin 2 every other day basis.  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    Plan    Pros and cons of vaginal  estrogen reviewed. Considering this is significantly impairing marital bliss course of asked her Lucianne Lei, 1 applicator full daily for 1 week and then twice a week thereafter is been recommended. She's been asked to call in 1 month with a progress report.      Patient will be asked to return to the office in one year with a bilateral diagnotic mammogram. Rx sent estradiol cream.  PCP:  Venia Minks This information has been scribed by Gaspar Cola CMA.    Robert Bellow 08/22/2015, 11:31 AM

## 2015-08-24 ENCOUNTER — Telehealth: Payer: Self-pay | Admitting: *Deleted

## 2015-08-24 NOTE — Telephone Encounter (Signed)
Patient went to her pharmacy to pick up Estrace Vaginal Cream and it was going to cost her over $200 dollars. She wants to know if there is any other cream that she could use or if they have a generic brand.

## 2015-08-24 NOTE — Telephone Encounter (Addendum)
I talked with the pharmacy Premarin is $266 and Estrace is $283. Will check with [facility] for 0.1mg  Estradiol tablets and call pt back next week, pt agrees.

## 2015-08-24 NOTE — Telephone Encounter (Signed)
-----   Message from Robert Bellow, MD sent at 08/24/2015  1:30 PM EDT ----- Please contact [facility] and see if there is a cost difference between Premarin vaginal cream and Estrace cream. Thank you

## 2015-08-24 NOTE — Telephone Encounter (Deleted)
-----   Message from Robert Bellow, MD sent at 08/24/2015  1:30 PM EDT ----- Please contact [facility] and see if there is a cost difference between Premarin vaginal cream and Estrace cream. Thank you

## 2015-08-24 NOTE — Telephone Encounter (Signed)
Estradiol vaginal cream 0.2mg /gram compound is $45 for 30 gram tube at [place], pt agrees. RX sent. Use QHS for 1 week then twice a week and as needed.

## 2015-09-03 ENCOUNTER — Other Ambulatory Visit: Payer: Self-pay | Admitting: Family Medicine

## 2015-09-03 DIAGNOSIS — F4329 Adjustment disorder with other symptoms: Secondary | ICD-10-CM

## 2015-09-04 DIAGNOSIS — F4320 Adjustment disorder, unspecified: Secondary | ICD-10-CM | POA: Insufficient documentation

## 2015-11-07 ENCOUNTER — Other Ambulatory Visit: Payer: Self-pay

## 2015-11-12 ENCOUNTER — Other Ambulatory Visit: Payer: Self-pay | Admitting: General Surgery

## 2016-01-18 ENCOUNTER — Other Ambulatory Visit: Payer: Self-pay | Admitting: Family Medicine

## 2016-01-18 ENCOUNTER — Encounter: Payer: Self-pay | Admitting: Family Medicine

## 2016-01-18 ENCOUNTER — Ambulatory Visit
Admission: RE | Admit: 2016-01-18 | Discharge: 2016-01-18 | Disposition: A | Discharge status: Home / Self Care | Payer: 59 | Source: Ambulatory Visit | Attending: Family Medicine | Admitting: Family Medicine

## 2016-01-18 ENCOUNTER — Ambulatory Visit (INDEPENDENT_AMBULATORY_CARE_PROVIDER_SITE_OTHER): Payer: 59 | Admitting: Family Medicine

## 2016-01-18 VITALS — BP 122/80 | HR 116 | Temp 102.1°F | Resp 16 | Wt 161.0 lb

## 2016-01-18 DIAGNOSIS — J189 Pneumonia, unspecified organism: Secondary | ICD-10-CM | POA: Insufficient documentation

## 2016-01-18 DIAGNOSIS — B349 Viral infection, unspecified: Secondary | ICD-10-CM

## 2016-01-18 DIAGNOSIS — M1990 Unspecified osteoarthritis, unspecified site: Secondary | ICD-10-CM | POA: Insufficient documentation

## 2016-01-18 DIAGNOSIS — J181 Lobar pneumonia, unspecified organism: Principal | ICD-10-CM

## 2016-01-18 DIAGNOSIS — D0510 Intraductal carcinoma in situ of unspecified breast: Secondary | ICD-10-CM | POA: Insufficient documentation

## 2016-01-18 DIAGNOSIS — R918 Other nonspecific abnormal finding of lung field: Secondary | ICD-10-CM | POA: Diagnosis not present

## 2016-01-18 LAB — POC INFLUENZA A&B (BINAX/QUICKVUE)
INFLUENZA B, POC: NEGATIVE
Influenza A, POC: NEGATIVE

## 2016-01-18 MED ORDER — HYDROCODONE-HOMATROPINE 5-1.5 MG/5ML PO SYRP: ORAL_SOLUTION | ORAL | 0 refills | Status: DC

## 2016-01-18 MED ORDER — CEFDINIR 300 MG PO CAPS: 300.0000 mg | ORAL_CAPSULE | Freq: Two times a day (BID) | ORAL | 0 refills | Status: DC

## 2016-01-18 NOTE — Patient Instructions (Addendum)
Add sudafed (behind the counter) and continue Mucinex DM. We will call you with the x-ray results.

## 2016-01-18 NOTE — Progress Notes (Signed)
Subjective:     Patient ID: Megan Bass, female   DOB: [birth year], 50 y.o.   MRN: [id number]  HPI  Chief Complaint  Patient presents with  . URI    x 4 days. Fever, chills, sweats, body aches, congestion, PND, productive cough of green sputum. Pt has tried Mucinex for the sx.  No flu shot this season.   Review of Systems     Objective:   Physical Exam  Constitutional: She appears well-developed and well-nourished. She has a sickly appearance.  Ears: T.M's intact without inflammation Throat: mild tonsillar enlargement without erythema or exudate Neck: no cervical adenopathy Lungs: clear     Assessment:    1. Viral syndrome - POC Influenza A&B(BINAX/QUICKVUE) - DG Chest 2 View; Future - HYDROcodone-homatropine (HYCODAN) 5-1.5 MG/5ML syrup; 5 ml 4-6 hours as needed for cough  Dispense: 240 mL; Refill: 0    Plan:    Add decongestants. Further f/u pending x-ray report.

## 2016-01-22 ENCOUNTER — Ambulatory Visit (INDEPENDENT_AMBULATORY_CARE_PROVIDER_SITE_OTHER): Payer: 59 | Admitting: Family Medicine

## 2016-01-22 ENCOUNTER — Encounter: Payer: Self-pay | Admitting: Family Medicine

## 2016-01-22 ENCOUNTER — Telehealth: Payer: Self-pay | Admitting: Family Medicine

## 2016-01-22 VITALS — BP 118/82 | HR 100 | Temp 98.1°F | Resp 16 | Wt 159.0 lb

## 2016-01-22 DIAGNOSIS — B370 Candidal stomatitis: Secondary | ICD-10-CM | POA: Diagnosis not present

## 2016-01-22 MED ORDER — NYSTATIN 100000 UNIT/ML MT SUSP: 5.0000 mL | Freq: Four times a day (QID) | OROMUCOSAL | 0 refills | Status: DC

## 2016-01-22 NOTE — Telephone Encounter (Signed)
Patient seen in office for viral syndrome on 01/18/16, chest x-ray showed pneumonia. Patient reports sores at the roof of her mouth. Please review and advise. KW

## 2016-01-22 NOTE — Telephone Encounter (Signed)
appt scheduled at 3:45. KW

## 2016-01-22 NOTE — Progress Notes (Signed)
Subjective:     Patient ID: Megan Bass, female   DOB: 07/21/1965, 50 y.o.   MRN: [id number]  HPI  Chief Complaint  Patient presents with  . Pneumonia    FU from 01/18/2016. CXR Was positive for pneumonia. Pt was started [place]. Pt is now c/o "mouth sores". Last fever was Sunday morning. .  Accompanied by her husband today.   Review of Systems     Objective:   Physical Exam  Constitutional: She appears well-developed and well-nourished. No distress (occasional cough).  HENT:  Tongue is white coated but little comes off with tongue blade. A few 1-2 mm.ulcerative appearing lesion are on her hard palate.       Assessment:    1. Thrush, oral: will cover for thrush and monitor for improvement. - nystatin (MYCOSTATIN) 100000 UNIT/ML suspension; Take 5 mLs (500,000 Units total) by mouth 4 (four) times daily.  Dispense: 120 mL; Refill: 0    Plan:    Will monitor over the course of the week.

## 2016-01-22 NOTE — Telephone Encounter (Signed)
Pt stated that she has sores on the roof of her mouth that look like blisters. Pt stated they are very painful making it hard for her to eat and swallow. Pt stated that she noticed them on Saturday 01/20/16. Please advise. Thanks TNP

## 2016-01-22 NOTE — Patient Instructions (Signed)
Let me know how you are doing as the week progresses.

## 2016-01-22 NOTE — Telephone Encounter (Signed)
Put her on my schedule this afternoon.

## 2016-01-23 ENCOUNTER — Telehealth: Payer: Self-pay | Admitting: Family Medicine

## 2016-01-23 NOTE — Telephone Encounter (Signed)
Patient was advised as below she states that the Nystatin has been helping her mouth. KW

## 2016-01-23 NOTE — Telephone Encounter (Signed)
Pt has been seeing Mikki Santee for pneumonia.  She called saying she is not sleeping well.  She normally uses a C Pap machine and it does help her.  She wants to know if she can use the CP ap while she has pneumonia.  Please advise.  Her call back is [phone]

## 2016-01-23 NOTE — Telephone Encounter (Signed)
Yes, please resume C-Pap. Is the Nystatin helping your mouth?

## 2016-01-23 NOTE — Telephone Encounter (Signed)
Please review-aa 

## 2016-02-14 ENCOUNTER — Other Ambulatory Visit: Payer: Self-pay | Admitting: Family Medicine

## 2016-02-14 ENCOUNTER — Telehealth: Payer: Self-pay | Admitting: Family Medicine

## 2016-02-14 DIAGNOSIS — Z09 Encounter for follow-up examination after completed treatment for conditions other than malignant neoplasm: Secondary | ICD-10-CM

## 2016-02-14 NOTE — Telephone Encounter (Signed)
Chest x-ray ordered. May walk in.

## 2016-02-14 NOTE — Telephone Encounter (Signed)
Can you put in order for x-ray. Thanks [employer]

## 2016-02-14 NOTE — Telephone Encounter (Signed)
Patient has been advised. KW 

## 2016-02-14 NOTE — Telephone Encounter (Signed)
Pt states she was advised to call to back to have a follow up x-ray done to make sure pneumonia is gone.  Pt would like to have this scheduled as late in the day as possible.  CB#[phone]/MW

## 2016-02-15 ENCOUNTER — Ambulatory Visit
Admission: RE | Admit: 2016-02-15 | Discharge: 2016-02-15 | Disposition: A | Discharge status: Home / Self Care | Payer: 59 | Source: Ambulatory Visit | Attending: Family Medicine | Admitting: Family Medicine

## 2016-02-15 DIAGNOSIS — I700 Atherosclerosis of aorta: Secondary | ICD-10-CM | POA: Diagnosis not present

## 2016-02-15 DIAGNOSIS — Z09 Encounter for follow-up examination after completed treatment for conditions other than malignant neoplasm: Secondary | ICD-10-CM | POA: Insufficient documentation

## 2016-02-15 DIAGNOSIS — R0989 Other specified symptoms and signs involving the circulatory and respiratory systems: Secondary | ICD-10-CM | POA: Insufficient documentation

## 2016-02-16 ENCOUNTER — Telehealth: Payer: Self-pay

## 2016-02-16 ENCOUNTER — Other Ambulatory Visit: Payer: Self-pay | Admitting: Physician Assistant

## 2016-02-16 DIAGNOSIS — F4329 Adjustment disorder with other symptoms: Secondary | ICD-10-CM

## 2016-02-16 NOTE — Telephone Encounter (Signed)
Patient has been advised. KW 

## 2016-02-16 NOTE — Telephone Encounter (Signed)
-----   Message from Carmon Ginsberg, [place] sent at 02/15/2016  4:42 PM EST ----- Pneumonia has cleared

## 2016-02-19 NOTE — Telephone Encounter (Signed)
Last ov 01/22/16 Last filled 09/04/15

## 2016-05-23 ENCOUNTER — Telehealth: Payer: Self-pay

## 2016-05-23 NOTE — Telephone Encounter (Signed)
-----   Message from Carmon Ginsberg, [place] sent at 05/23/2016  8:13 AM EDT ----- Please have her schedule a visit to follow up sleep apnea. I have a form to sign for her equipment but I have not seen her for this.

## 2016-05-23 NOTE — Telephone Encounter (Signed)
Patient was advised, appt has been scheduled at 4:30 05/25/15. KW

## 2016-05-24 ENCOUNTER — Encounter: Payer: Self-pay | Admitting: Family Medicine

## 2016-05-24 ENCOUNTER — Ambulatory Visit (INDEPENDENT_AMBULATORY_CARE_PROVIDER_SITE_OTHER): Payer: 59 | Admitting: Family Medicine

## 2016-05-24 VITALS — BP 104/60 | HR 77 | Temp 98.7°F | Resp 16 | Wt 157.8 lb

## 2016-05-24 DIAGNOSIS — Z1211 Encounter for screening for malignant neoplasm of colon: Secondary | ICD-10-CM | POA: Diagnosis not present

## 2016-05-24 DIAGNOSIS — Z23 Encounter for immunization: Secondary | ICD-10-CM | POA: Diagnosis not present

## 2016-05-24 NOTE — Patient Instructions (Signed)
We are making the referral to Cologuard and you should get the materials in the mail. Sarah, our referral person, is the contact if you don't hear from them.

## 2016-05-24 NOTE — Progress Notes (Signed)
Subjective:     Patient ID: Megan Bass, female   DOB: 03-03-1966, 51 y.o.   MRN: [id number]  HPI  Chief Complaint  Patient presents with  . Sleep Apnea    Patient comes in office today to discuss getting a prescription for a replacment hose and mask for her CPAP machine, patient reports she has a hole in it and has been unable to use it. Patient states that her husband has mentioned that she is snoring at night, patient denies that she has stopped breathing while asleep.   States she had sinus surgery and was told by her ENT she no longer had sleep apnea. She had been using her equipment sporadically-when she had pneumonia last year- and no longer feels she needs it. Epworth score today: 3. She also weighs 11# less than when the sleep study was performed in 2012.  Also wishes to have a cancer screen with Cologuard.   Review of Systems     Objective:   Physical Exam  Constitutional: She appears well-developed and well-nourished. No distress.       Assessment:    1. Need for tetanus booster - Tdap vaccine greater than or equal to 7yo IM  2. Need for influenza vaccination - Flu Vaccine QUAD 36+ mos IM  3. Screening for colon cancer - Cologuard    Plan:    Further f/u pending Cologuard results.

## 2016-05-27 ENCOUNTER — Telehealth: Payer: Self-pay | Admitting: Family Medicine

## 2016-05-27 NOTE — Telephone Encounter (Signed)
Order for cologuard faxed to [facility] °

## 2016-06-12 ENCOUNTER — Other Ambulatory Visit: Payer: Self-pay

## 2016-06-12 DIAGNOSIS — D0501 Lobular carcinoma in situ of right breast: Secondary | ICD-10-CM

## 2016-07-02 ENCOUNTER — Other Ambulatory Visit: Payer: Self-pay | Admitting: Family Medicine

## 2016-07-02 LAB — COLOGUARD

## 2016-08-02 ENCOUNTER — Other Ambulatory Visit: Payer: Self-pay | Admitting: Family Medicine

## 2016-08-02 DIAGNOSIS — F4329 Adjustment disorder with other symptoms: Secondary | ICD-10-CM

## 2016-08-12 ENCOUNTER — Encounter: Payer: Self-pay | Admitting: Obstetrics and Gynecology

## 2016-08-12 ENCOUNTER — Telehealth: Payer: Self-pay | Admitting: Obstetrics and Gynecology

## 2016-08-12 ENCOUNTER — Ambulatory Visit (INDEPENDENT_AMBULATORY_CARE_PROVIDER_SITE_OTHER): Payer: 59

## 2016-08-12 ENCOUNTER — Ambulatory Visit (INDEPENDENT_AMBULATORY_CARE_PROVIDER_SITE_OTHER): Payer: 59 | Admitting: Obstetrics and Gynecology

## 2016-08-12 ENCOUNTER — Other Ambulatory Visit: Payer: Self-pay | Admitting: Obstetrics and Gynecology

## 2016-08-12 VITALS — BP 128/78 | HR 76 | Ht 63.0 in | Wt 163.0 lb

## 2016-08-12 DIAGNOSIS — R102 Pelvic and perineal pain: Secondary | ICD-10-CM

## 2016-08-12 DIAGNOSIS — R300 Dysuria: Secondary | ICD-10-CM

## 2016-08-12 LAB — POCT URINALYSIS DIPSTICK
BILIRUBIN UA: NEGATIVE
Glucose, UA: NEGATIVE
Ketones, UA: NEGATIVE
LEUKOCYTES UA: NEGATIVE
NITRITE UA: NEGATIVE
PH UA: 5 (ref 5.0–8.0)
PROTEIN UA: NEGATIVE
RBC UA: NEGATIVE
Spec Grav, UA: <=1.005 — AB (ref 1.010–1.025)
Urobilinogen, UA: NEGATIVE E.U./dL — AB

## 2016-08-12 NOTE — Progress Notes (Signed)
Chief Complaint  Patient presents with  . Abdominal Pain    HPI:      Megan Bass is a 51 y.o. G1P1 who LMP was No LMP recorded. Patient has had a hysterectomy., presents today for new onset RLQ pain that started yesterday. Sx were a constant sharp pain, worse when bending over. Sx are more of a milder pain today in the suprapubic area, and are worse when walking and sitting down. She is s/p supracx hyst. No bleeding/spotting, No urin sx except a little discomfort when initiating stream, no vag sx, LBP, fevers. She had some hard stool a couple days ago and then 3 normal BMs today, but she doesn't usually have 3 BMs in a day. She has had increased gas and took gas-x yesterday with some relief. No diarrhea/loose stools.     Patient Active Problem List   Diagnosis Date Noted  . Arthritis 01/18/2016  . 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 Pneumonia 01/18/2016  . Adaptation reaction 09/04/2015  . Vaginal atrophy 08/22/2015  . Menopausal disorder 08/16/2014  . Costochondritis 05/05/2014  . 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 Abnormal mammogram with microcalcification 08/12/2013    Family History  Problem Relation Age of Onset  . Hypertension Mother   . COPD Father   . Hypertension Brother   . Cancer Neg Hx     Social History   Social History  . Marital status: Married    Spouse name: N/A  . Number of children: N/A  . Years of education: N/A   Occupational History  . Not on file.   Social History Main Topics  . Smoking status: Never Smoker  . Smokeless tobacco: Never Used  . Alcohol use Yes     Comment: occasionally  . Drug use: No  . Sexual activity: Yes    Birth control/ protection: Surgical   Other Topics Concern  . Not on file   Social History Narrative  . No narrative on file     Current Outpatient Prescriptions:  .  aspirin 81 MG tablet, Take 81 mg by mouth daily.,  Disp: , Rfl:  .  Calcium Carbonate-Vit D-Min (CALCIUM 1200 PO), Take 1 tablet by mouth daily., Disp: , Rfl:  .  cetirizine (ZYRTEC) 10 MG tablet, Take 10 mg by mouth daily., Disp: , Rfl:  .  cholecalciferol (VITAMIN D) 1000 UNITS tablet, Take 1,000 Units by mouth daily., Disp: , Rfl:  .  citalopram (CELEXA) 20 MG tablet, TAKE 1 TABLET BY MOUTH  DAILY, Disp: 90 tablet, Rfl: 1 .  Ergotamine Tartrate POWD, Take 1 tablet by mouth 2 (two) times daily., Disp: , Rfl:  .  Ergotamine-PB-Belladonna (BEL/PHEN/ERGOT SR PO), Take by mouth., Disp: , Rfl:  .  INTRAROSA 6.5 MG INST, PLACE 1 INSERT VAGINALLY EVERY NIGHT, Disp: , Rfl: 11 .  loratadine (CLARITIN REDITABS) 10 MG dissolvable tablet, loratadine 10 mg disintegrating tablet  Take 1 tablet every day by oral route., Disp: , Rfl:  .  mometasone (NASONEX) 50 MCG/ACT nasal spray, Place 2 sprays into the nose daily., Disp: , Rfl:  .  tamoxifen (NOLVADEX) 20 MG tablet, Take 1 tablet by mouth  every day, Disp: 90 tablet, Rfl: 4 .  triamcinolone (NASACORT ALLERGY 24HR) 55 MCG/ACT AERO nasal inhaler, Nasacort, Disp: , Rfl:   Review of Systems  Constitutional: Negative for fever.  Gastrointestinal: Negative for blood in stool, constipation, diarrhea, nausea and vomiting.  Genitourinary: Positive for pelvic pain. Negative for dyspareunia, dysuria, flank pain, frequency, hematuria, urgency, vaginal bleeding, vaginal discharge and vaginal pain.  Musculoskeletal: Negative for back pain.  Skin: Negative for rash.     OBJECTIVE:   Vitals:  BP 128/78   Pulse 76   Ht 5\' 3"  (1.6 m)   Wt 163 lb (73.9 kg)   BMI 28.87 kg/m   Physical Exam  Constitutional: She is oriented to person, place, and time and well-developed, well-nourished, and in no distress. Vital signs are normal.  Abdominal: Soft. Normal appearance. She exhibits no distension and no mass. There is tenderness in the right lower quadrant, suprapubic area and left lower quadrant. There is no rebound.    Genitourinary: Vagina normal, cervix normal, right adnexa normal, left adnexa normal and vulva normal. Cervix exhibits no motion tenderness and no tenderness. Right adnexum displays no mass and no tenderness. Left adnexum displays no mass and no tenderness. Vulva exhibits no erythema, no exudate, no lesion, no rash and no tenderness. Vagina exhibits no lesion.  Genitourinary Comments: UTERUS SURG ABSENT  Neurological: She is oriented to person, place, and time.  Vitals reviewed.   Results: Results for orders placed or performed in visit on 08/12/16 (from the past 24 hour(s))  POCT Urinalysis Dipstick     Status: Normal   Collection Time: 08/12/16  1:47 PM  Result Value Ref Range   Color, UA yellow    Clarity, UA     Glucose, UA neg    Bilirubin, UA neg    Ketones, UA neg    Spec Grav, UA <=1.005 (A) 1.010 - 1.025   Blood, UA neg    pH, UA 5.0 5.0 - 8.0   Protein, UA neg    Urobilinogen, UA negative (A) 0.2 or 1.0 E.U./dL   Nitrite, UA neg    Leukocytes, UA Negative Negative   GYN u/s--> BILAT OVAR WITH FOLLICLES; NO FF IN CDS; UTERUS ABSENT; CX WNL  Assessment/Plan: Pelvic pain - Neg dipstick, neg u/s. Tender on exam. Question GI given constipation a few days ago. Try pepto/Gas-x. Follow sx. Check C&S. Will f/u with results and sx. - Plan: Urine Culture, POCT Urinalysis Dipstick  Dysuria - Plan: Urine Culture, POCT Urinalysis Dipstick     Return if symptoms worsen or fail to improve.  Megan Geno B. Kristy Catoe, [name] 08/12/2016 1:53 PM

## 2016-08-12 NOTE — Telephone Encounter (Signed)
Pt is schedule for Ultrsound

## 2016-08-12 NOTE — Telephone Encounter (Signed)
Pt is schedule today for abdominal pain. Would you like for her to have ultrasound schedule

## 2016-08-12 NOTE — Telephone Encounter (Signed)
Yes, that would be great.  Thx

## 2016-08-14 LAB — URINE CULTURE: Organism ID, Bacteria: NO GROWTH

## 2016-08-21 ENCOUNTER — Ambulatory Visit
Admission: RE | Admit: 2016-08-21 | Discharge: 2016-08-21 | Disposition: A | Discharge status: Home / Self Care | Payer: 59 | Source: Ambulatory Visit | Attending: General Surgery | Admitting: General Surgery

## 2016-08-21 ENCOUNTER — Other Ambulatory Visit: Payer: Self-pay | Admitting: General Surgery

## 2016-08-21 DIAGNOSIS — D0501 Lobular carcinoma in situ of right breast: Secondary | ICD-10-CM

## 2016-08-21 DIAGNOSIS — Z853 Personal history of malignant neoplasm of breast: Secondary | ICD-10-CM | POA: Insufficient documentation

## 2016-09-11 ENCOUNTER — Ambulatory Visit (INDEPENDENT_AMBULATORY_CARE_PROVIDER_SITE_OTHER): Payer: 59 | Admitting: General Surgery

## 2016-09-11 ENCOUNTER — Encounter: Payer: Self-pay | Admitting: General Surgery

## 2016-09-11 VITALS — BP 144/90 | HR 97 | Resp 12 | Ht 63.0 in | Wt 158.0 lb

## 2016-09-11 DIAGNOSIS — D0501 Lobular carcinoma in situ of right breast: Secondary | ICD-10-CM | POA: Diagnosis not present

## 2016-09-11 NOTE — Patient Instructions (Signed)
Patient will be asked to return to the office in one year with a bilateral screening mammogram. The patient is aware to call back for any questions or concerns. 

## 2016-09-11 NOTE — Progress Notes (Addendum)
Patient ID: Megan Bass, female   DOB: 1965-12-21, 52 y.o.   MRN: [id number]  Chief Complaint  Patient presents with  . Follow-up    mammogram     HPI Megan Bass is a 51 y.o. female who presents for a breast evaluation. The most recent mammogram was done on 08/21/2016.  Patient does perform regular self breast checks and gets regular mammograms done. Tolerating Tamoxifen. She did the color guard screening and was negative.   HPI  Past Medical History:  Diagnosis Date  . Allergy   . Anxiety   . Malignant neoplasm of upper-outer quadrant of female breast Megan Bass) October 14, 2013   Right breast excision ADH./LCIS.     Past Surgical History:  Procedure Laterality Date  . ABDOMINAL HYSTERECTOMY  02/2012  . BREAST BIOPSY Right 6.22.15   LOBULAR CARCINOMA IN SITU/ATYPICAL DUCTAL HYPERPLASIA   . BREAST BIOPSY Right 09/10/2013   fibrocystic change and pseudoangiomatous stromal  . BREAST EXCISIONAL BIOPSY Right 10/14/2013   breast ca right   . BREAST SURGERY Right 10/14/13   lumpectomy  . ingrown toe nail  2012  . NASAL SINUS SURGERY  11/2011  . OTHER SURGICAL HISTORY     color guard done 2018  . TUBAL LIGATION      Family History  Problem Relation Age of Onset  . Hypertension Mother   . COPD Father   . Hypertension Brother   . Cancer Neg Hx     Social History Social History  Substance Use Topics  . Smoking status: Never Smoker  . Smokeless tobacco: Never Used  . Alcohol use Yes     Comment: occasionally    Allergies  Allergen Reactions  . Iodides Itching  . Povidone Iodine   . Sulfa Antibiotics Rash    Current Outpatient Prescriptions  Medication Sig Dispense Refill  . aspirin 81 MG tablet Take 81 mg by mouth daily.    . Calcium Carbonate-Vit D-Min (CALCIUM 1200 PO) Take 1 tablet by mouth daily.    . cetirizine (ZYRTEC) 10 MG tablet Take 10 mg by mouth daily.    . cholecalciferol (VITAMIN D) 1000 UNITS tablet Take 1,000 Units by mouth daily.    .  citalopram (CELEXA) 20 MG tablet TAKE 1 TABLET BY MOUTH  DAILY 90 tablet 1  . Ergotamine Tartrate POWD Take 1 tablet by mouth 2 (two) times daily.    . Ergotamine-PB-Belladonna (BEL/PHEN/ERGOT SR PO) Take by mouth.    Megan Bass 6.5 MG INST PLACE 1 INSERT VAGINALLY EVERY NIGHT  11  . loratadine (CLARITIN REDITABS) 10 MG dissolvable tablet loratadine 10 mg disintegrating tablet  Take 1 tablet every day by oral route.    . mometasone (NASONEX) 50 MCG/ACT nasal spray Place 2 sprays into the nose daily.    . tamoxifen (NOLVADEX) 20 MG tablet Take 1 tablet by mouth  every day 90 tablet 4  . triamcinolone (NASACORT ALLERGY 24HR) 55 MCG/ACT AERO nasal inhaler Nasacort     No current facility-administered medications for this visit.     Review of Systems Review of Systems  Constitutional: Negative.   Respiratory: Negative.   Cardiovascular: Negative.     Blood pressure (!) 144/90, pulse 97, resp. rate 12, height 5\' 3"  (1.6 m), weight 158 lb (71.7 kg).  Physical Exam Physical Exam  Constitutional: She is oriented to person, place, and time. She appears well-developed and well-nourished.  Eyes: Conjunctivae are normal. No scleral icterus.  Neck: Neck supple.  Cardiovascular: Normal rate,  regular rhythm and normal heart sounds.   Pulmonary/Chest: Effort normal and breath sounds normal. Right breast exhibits no inverted nipple, no mass, no nipple discharge, no skin change and no tenderness. Left breast exhibits no inverted nipple, no mass, no nipple discharge, no skin change and no tenderness.    Abdominal: Soft. Bowel sounds are normal. There is no tenderness.  Lymphadenopathy:    She has no cervical adenopathy.    She has no axillary adenopathy.  Neurological: She is alert and oriented to person, place, and time.  Skin: Skin is dry.    Data Reviewed August 21, 2016 mammogram reviewed. BIRAD 2.  Recommendation for screening study next year.   Cologuard July 01, 2016:  Negative.  Assessment    Stable breast exam.     Plan         Patient will be asked to return to the office in one year with a bilateral screening mammogram.The patient is aware to call back for any questions or concerns.  HPI, Physical Exam, Assessment and Plan have been scribed under the direction and in the presence of Hervey Ard, MD.  Gaspar Cola, CMA  I have completed the exam and reviewed the above documentation for accuracy and completeness.  I agree with the above.  Haematologist has been used and any errors in dictation or transcription are unintentional.  Hervey Ard, M.D., F.A.C.S.  Megan Bass 09/12/2016, 10:03 PM

## 2016-09-27 ENCOUNTER — Ambulatory Visit (INDEPENDENT_AMBULATORY_CARE_PROVIDER_SITE_OTHER): Payer: 59 | Admitting: Family Medicine

## 2016-09-27 ENCOUNTER — Encounter: Payer: Self-pay | Admitting: Family Medicine

## 2016-09-27 VITALS — BP 130/92 | HR 62 | Temp 98.2°F | Resp 16 | Wt 160.6 lb

## 2016-09-27 DIAGNOSIS — G43119 Migraine with aura, intractable, without status migrainosus: Secondary | ICD-10-CM | POA: Insufficient documentation

## 2016-09-27 DIAGNOSIS — G43009 Migraine without aura, not intractable, without status migrainosus: Secondary | ICD-10-CM

## 2016-09-27 MED ORDER — KETOROLAC TROMETHAMINE 60 MG/2ML IM SOLN
60.0000 mg | Freq: Once | INTRAMUSCULAR | Status: AC
  Administered 2016-09-27: 60 mg via INTRAMUSCULAR

## 2016-09-27 NOTE — Progress Notes (Signed)
Subjective:     Patient ID: Megan Bass, female   DOB: 1965-08-08, 51 y.o.   MRN: [id number]  HPI  Chief Complaint  Patient presents with  . Migraine    Patient comes in office today with complaints of migraine headaches that began two days ago and increasingly getting worse.Patient states that she has a history of migraines and usually only had them once a month. Patient describes pain as a dull ache and states that pain is felt behind right eye. Patient has been taking otc Excedrin Migraine relief and Mucinex  . Ear Pain    Patient reports ear pain and pressure for the past 2 days.   Denies precedent allergy symptoms. Reports monthly migraines which usually are aborted with Excedrin migraine. States she woke up with this headache and has had transient relief with Excedrin migraine and a left over hydrocodone last night. States pain rebounded today and she was "in tears". Has taken Excedrin migraine this AM with some moderation in her headache pain.   Review of Systems  Neurological:       Reports occasional spots and photophobia with her headaches.       Objective:   Physical Exam  Constitutional: She appears well-developed and well-nourished. No distress.  Eyes: Pupils are equal, round, and reactive to light. EOM are normal.  Musculoskeletal:  Grip strength 5/5  Neurological: Coordination (finger to nose WNL) normal.       Assessment:    1. Migraine without aura and without status migrainosus, not intractable: ? Rebound headache - ketorolac (TORADOL) injection 60 mg; Inject 2 mLs (60 mg total) into the muscle once.    Plan:    Discussed staying off further analgesics.

## 2016-09-27 NOTE — Patient Instructions (Signed)
Stop analgesic medication

## 2016-10-28 ENCOUNTER — Encounter: Payer: Self-pay | Admitting: Family Medicine

## 2016-10-28 ENCOUNTER — Ambulatory Visit (INDEPENDENT_AMBULATORY_CARE_PROVIDER_SITE_OTHER): Payer: 59 | Admitting: Family Medicine

## 2016-10-28 VITALS — BP 138/84 | HR 67 | Temp 98.8°F | Resp 16 | Wt 162.0 lb

## 2016-10-28 DIAGNOSIS — J019 Acute sinusitis, unspecified: Secondary | ICD-10-CM | POA: Diagnosis not present

## 2016-10-28 MED ORDER — AMOXICILLIN-POT CLAVULANATE 875-125 MG PO TABS: 1.0000 | ORAL_TABLET | Freq: Two times a day (BID) | ORAL | 0 refills | Status: DC

## 2016-10-28 NOTE — Progress Notes (Signed)
Subjective:     Patient ID: Megan Bass, female   DOB: 16-Sep-1965, 51 y.o.   MRN: [id number]  HPI  Chief Complaint  Patient presents with  . Sore Throat    Patient comes in office today with conrens of cold like symptoms for the past 7 days. Patient reports the following; laryngitis, sinus drainage, shortness of breath, sore throat, fatigue, ear fullness, swelling of lymph node on right side and body chills.Patient reports takisng otc Mucinex Flu and Mucinex DM.  Reports bloody to purulent sinus drainage in the AM wth occasional cough. Has just flown back from vacation when her sx started. Has been using Mucinex DM.   Review of Systems     Objective:   Physical Exam  Constitutional: She appears well-developed and well-nourished. No distress.  Ears: T.M's intact without inflammation Throat: mild tonsillar enlargement without exudate or erythema Neck: no cervical adenopathy Lungs: clear     Assessment:    1. Acute non-recurrent sinusitis, unspecified location - amoxicillin-clavulanate (AUGMENTIN) 875-125 MG tablet; Take 1 tablet by mouth 2 (two) times daily.  Dispense: 20 tablet; Refill: 0    Plan:    Add pseudoephedrine for congestion.

## 2016-10-28 NOTE — Patient Instructions (Signed)
Discussed use of pseudoephedrine decongestant and Delsym for cough.

## 2016-12-12 ENCOUNTER — Encounter: Payer: Self-pay | Admitting: Family Medicine

## 2016-12-12 ENCOUNTER — Ambulatory Visit (INDEPENDENT_AMBULATORY_CARE_PROVIDER_SITE_OTHER): Payer: 59 | Admitting: Family Medicine

## 2016-12-12 VITALS — BP 134/84 | HR 79 | Temp 97.9°F | Resp 16 | Wt 162.2 lb

## 2016-12-12 DIAGNOSIS — N951 Menopausal and female climacteric states: Secondary | ICD-10-CM

## 2016-12-12 LAB — POCT URINALYSIS DIPSTICK
Bilirubin, UA: NEGATIVE
Glucose, UA: NEGATIVE
Ketones, UA: NEGATIVE
Leukocytes, UA: NEGATIVE
NITRITE UA: NEGATIVE
PROTEIN UA: NEGATIVE
RBC UA: NEGATIVE
UROBILINOGEN UA: 0.2 U/dL
pH, UA: 5 (ref 5.0–8.0)

## 2016-12-12 MED ORDER — CLONIDINE HCL 0.1 MG PO TABS: ORAL_TABLET | ORAL | 0 refills | Status: DC

## 2016-12-12 NOTE — Patient Instructions (Signed)
Let me know if the medication helps with your symptoms.

## 2016-12-12 NOTE — Progress Notes (Signed)
Subjective:     Patient ID: Megan Bass, female   DOB: January 07, 1966, 51 y.o.   MRN: [id number]  HPI  Chief Complaint  Patient presents with  . Hot Flashes    Patient comes in office toay stating that she was started on Tamoxifen 4 years ago and has been off medication for the past 2 months due to the fact that pharmacy no longer makes compound for the medication and she states it was missing an ingredient that was normally in formula. Patient compains of hot flashes and waking up dreneched in sweat having to change twice a night.   . Abdominal Pain    Patient reports lower abdominal pain and strong urinary odor for a week. Patient reports that she got over a yeast infection but has been experincing vaginal dryness and irriation.   States she has not been able to get her Bellargal compounded due to an Architect. She uses this to counteract tamoxifen side effects. Remains on a progesterone vaginal supp.for vaginal dryness.   Review of Systems     Objective:   Physical Exam  Constitutional: She appears well-developed and well-nourished. No distress.       Assessment:    1. Vasomotor symptoms due to menopause - cloNIDine (CATAPRES) 0.1 MG tablet; Take one or two x daily for vasomotor symptoms  Dispense: 30 tablet; Refill: 0 - POCT urinalysis dipstick    Plan:    Phone f/u in the next few days. Consider trial on paroxetine.

## 2016-12-23 ENCOUNTER — Encounter: Payer: Self-pay | Admitting: Family Medicine

## 2016-12-24 ENCOUNTER — Other Ambulatory Visit: Payer: Self-pay | Admitting: Obstetrics & Gynecology

## 2016-12-26 ENCOUNTER — Other Ambulatory Visit: Payer: Self-pay | Admitting: Family Medicine

## 2016-12-26 DIAGNOSIS — N951 Menopausal and female climacteric states: Secondary | ICD-10-CM

## 2016-12-26 MED ORDER — PAROXETINE HCL 20 MG PO TABS: 20.0000 mg | ORAL_TABLET | Freq: Every day | ORAL | 0 refills | Status: DC

## 2016-12-31 ENCOUNTER — Other Ambulatory Visit: Payer: Self-pay | Admitting: Family Medicine

## 2016-12-31 DIAGNOSIS — N951 Menopausal and female climacteric states: Secondary | ICD-10-CM

## 2016-12-31 MED ORDER — VENLAFAXINE HCL ER 75 MG PO CP24: 75.0000 mg | ORAL_CAPSULE | Freq: Every day | ORAL | 0 refills | Status: DC

## 2017-01-17 ENCOUNTER — Encounter: Payer: Self-pay | Admitting: Family Medicine

## 2017-01-27 ENCOUNTER — Other Ambulatory Visit: Payer: Self-pay | Admitting: Family Medicine

## 2017-01-27 ENCOUNTER — Other Ambulatory Visit: Payer: Self-pay | Admitting: General Surgery

## 2017-01-27 DIAGNOSIS — N951 Menopausal and female climacteric states: Secondary | ICD-10-CM

## 2017-01-31 ENCOUNTER — Other Ambulatory Visit: Payer: Self-pay | Admitting: Family Medicine

## 2017-01-31 DIAGNOSIS — N951 Menopausal and female climacteric states: Secondary | ICD-10-CM

## 2017-03-13 ENCOUNTER — Other Ambulatory Visit: Payer: Self-pay | Admitting: Family Medicine

## 2017-03-13 DIAGNOSIS — F4329 Adjustment disorder with other symptoms: Secondary | ICD-10-CM

## 2017-03-14 ENCOUNTER — Other Ambulatory Visit: Payer: Self-pay | Admitting: Family Medicine

## 2017-03-14 ENCOUNTER — Encounter: Payer: Self-pay | Admitting: Family Medicine

## 2017-03-14 NOTE — Telephone Encounter (Signed)
Please review-[name], RMA  

## 2017-03-14 NOTE — Telephone Encounter (Signed)
Please call in medication below: take one or two daily as needed for flushes/sweating; #[phone]

## 2017-03-14 NOTE — Telephone Encounter (Signed)
[facility] faxed refill request for the following medications:  ERGOT/BELLAD/PHENO CAPS Qty: 60 No MG or MCG was noted in the request.  LOV: 12/12/16 Please advise. Thanks TNP

## 2017-03-14 NOTE — Telephone Encounter (Signed)
Prescription has been called into [facility]. KW

## 2017-03-18 ENCOUNTER — Ambulatory Visit (INDEPENDENT_AMBULATORY_CARE_PROVIDER_SITE_OTHER): Payer: 59 | Admitting: Obstetrics and Gynecology

## 2017-03-18 ENCOUNTER — Encounter: Payer: Self-pay | Admitting: Obstetrics and Gynecology

## 2017-03-18 VITALS — BP 134/80 | HR 98 | Ht 63.0 in | Wt 162.0 lb

## 2017-03-18 DIAGNOSIS — N760 Acute vaginitis: Secondary | ICD-10-CM | POA: Diagnosis not present

## 2017-03-18 MED ORDER — PRASTERONE 6.5 MG VA INST: 1.0000 | VAGINAL_INSERT | Freq: Every day | VAGINAL | 11 refills | Status: DC

## 2017-03-18 NOTE — Progress Notes (Signed)
Obstetrics & Gynecology Office Visit   Chief Complaint:  Chief Complaint  Patient presents with  . menopausal pain    vaginal dryness/painful intercourse  . vaginal irritation    discharge w/yellow tint    History of Present Illness: 52 year old female status post prior hysterectomy presenting for exacerbation of vulvovaginal atrophy and dyspareunia.  Ovaries were preserved at the time of her original hysterectomy.  She is also currently on Tamoxifen because of a history of LCIS followed by Dr. Bary Castilla.  She had previously been placed on vaginal estrogen, but most recently was started on Interosa (DHEA) and reported good improvement in symptoms.  As symptoms improved she became less diligent about her daily dosing.  She subsequently developed a URI was placed on antibiotics and developed a yeast infections in December.  She feels since that time symptoms have exacerbated again.  She has not been on the Interosa as directed for the past 13 weeks.  She reports both internal irritation and dysparunia as well as external irritation.  She has changed to looser fitting underwear, switch to unscented soaps.  She does have some light vaginal discharge but attributes this to the Interosa.  No obvious skin changes noted by the patient.     Review of Systems: Review of systems negative unless otherwise noted in HPI  Past Medical History:  Past Medical History:  Diagnosis Date  . Allergy   . Anxiety   . Malignant neoplasm of upper-outer quadrant of female breast [facility]) October 14, 2013   Right breast excision ADH./LCIS.     Past Surgical History:  Past Surgical History:  Procedure Laterality Date  . ABDOMINAL HYSTERECTOMY  02/2012  . BREAST BIOPSY Right 6.22.15   LOBULAR CARCINOMA IN SITU/ATYPICAL DUCTAL HYPERPLASIA   . BREAST BIOPSY Right 09/10/2013   fibrocystic change and pseudoangiomatous stromal  . BREAST EXCISIONAL BIOPSY Right 10/14/2013   breast ca right   . BREAST SURGERY Right  10/14/13   lumpectomy  . ingrown toe nail  2012  . NASAL SINUS SURGERY  11/2011  . OTHER SURGICAL HISTORY     color guard done 2018  . TUBAL LIGATION      Gynecologic History: No LMP recorded. Patient has had a hysterectomy.  Obstetric History: G1P1001  Family History:  Family History  Problem Relation Age of Onset  . Hypertension Mother   . COPD Father   . Hypertension Brother   . Cancer Neg Hx     Social History:  Social History   Socioeconomic History  . Marital status: Married    Spouse name: Not on file  . Number of children: Not on file  . Years of education: Not on file  . Highest education level: Not on file  Social Needs  . Financial resource strain: Not on file  . Food insecurity - worry: Not on file  . Food insecurity - inability: Not on file  . Transportation needs - medical: Not on file  . Transportation needs - non-medical: Not on file  Occupational History  . Not on file  Tobacco Use  . Smoking status: Never Smoker  . Smokeless tobacco: Never Used  Substance and Sexual Activity  . Alcohol use: Yes    Comment: occasionally  . Drug use: No  . Sexual activity: Yes    Birth control/protection: Surgical  Other Topics Concern  . Not on file  Social History Narrative  . Not on file    Allergies:  Allergies  Allergen Reactions  . Iodides Itching  . Povidone Iodine   . Sulfa Antibiotics Rash    Medications: Prior to Admission medications   Medication Sig Start Date End Date Taking? Authorizing Provider  aspirin 81 MG tablet Take 81 mg by mouth daily.   Yes [provider]  Calcium Carbonate-Vit D-Min (CALCIUM 1200 PO) Take 1 tablet by mouth daily.   Yes [provider]  cetirizine (ZYRTEC) 10 MG tablet Take 10 mg by mouth daily.   Yes [provider]  cholecalciferol (VITAMIN D) 1000 UNITS tablet Take 1,000 Units by mouth daily.   Yes [provider]  citalopram (CELEXA) 20 MG tablet TAKE 1 TABLET BY MOUTH   DAILY 03/14/17  Yes Carmon Ginsberg, PA  loratadine (CLARITIN REDITABS) 10 MG dissolvable tablet loratadine 10 mg disintegrating tablet  Take 1 tablet every day by oral route.   Yes [provider]  Prasterone (INTRAROSA) 6.5 MG INST Place 1 capsule vaginally at bedtime. 03/18/17  Yes Malachy Mood, MD  tamoxifen (NOLVADEX) 20 MG tablet TAKE 1 TABLET BY MOUTH  EVERY DAY 01/28/17  Yes Byrnett, Forest Gleason, MD  triamcinolone (NASACORT ALLERGY 24HR) 55 MCG/ACT AERO nasal inhaler Nasacort   Yes [provider]    Physical Exam Vitals:  Vitals:   03/18/17 0802  BP: 134/80  Pulse: 98   No LMP recorded. Patient has had a hysterectomy.  General: NAD HEENT: normocephalic, anicteric Pulmonary: No increased work of breathing Genitourinary:  External: Normal external female genitalia.  There are small fissures noted between the labia major and labia minora. Normal urethral meatus, normal Bartholin's and Skene's glands.    Vagina: Normal vaginal mucosa, no evidence of prolapse.    Cervix: surgically absent  Uterus: surgically absent  Adnexa: ovaries non-enlarged, no adnexal masses  Rectal: deferred  Lymphatic: no evidence of inguinal lymphadenopathy Extremities: no edema, erythema, or tenderness Neurologic: Grossly intact Psychiatric: mood appropriate, affect full  Female chaperone present for pelvic and breast  portions of the physical exam  Assessment: 52 y.o. G1P1001 with vulvovaginal atrophy  Plan: Problem List Items Addressed This Visit    None    Visit Diagnoses    Vaginitis and vulvovaginitis    -  Primary   Relevant Orders   NuSwab BV and Candida, NAA     - currently on interosa, with initial improvement in symptoms now worsening again had antibiotic exposure and subsequent yeast infection - we discussed normal vaginal flora, various insults which may cause disruptions in the flora and predispose to candida or BV.  We discussed role of vaginal probiotics -  Nuswab collected - I would advise against steroid application as it may further thin skin, she may use a barrier cream or her previously prescribed nystatin as a barrier cream - We discussed tamoxifen having estrogen like properties in some tissues anti-estrogen properties in other tissues which may also be responsible for some of the symptoms she has noted - Average age of menopause is 64, menopause is a clinical definition referring to one year of amenorrhea.  Her ovarian function may be declining which could also lead to exacerbation of symptoms - Return in about 3 months (around 06/16/2017) for follow up .

## 2017-03-18 NOTE — Patient Instructions (Signed)
We discussed WHI study findings in detail.  In the combined estrogen-progesterone arm breast cancer risk was increased by 1.26 (CI of 1.00 to 1.59), coronary heart disease 1.29 (CI 1.02-1.63), stroke risk 1.41 (1.07-1.85), and pulmonary embolism 2.13 (CI 1.39-3.25).  That being said the while statistically significant the actual number of cases attributable are relatively small at an addition 8 cases of breast cancer, 7 more coronary artery event, 8 more strokes, and 8 additional case of pulmonary embolism per 10,000 women.  Study was terminated because of the increased breast cancer risk, this was not seen in the progestin only arm of the study for women without an intact uterus.  In addition it is important to note that HRT also had positive or risk reducing effects, and all cause mortality between the HRT/non-HRT users is not statistically different.  Estrogen-progestin HRT decreased the relative risk of hip fracture 0.66 (CI 0.45-0.98), colorectal cancer 0.63 (0.43-0.92).  Current consensus is to limit dose to the lowest effective dose, and shortest treatment duration possible.  Breast cancer risk appeared to increase after 4 years of use.  Also important to note is that these risk refer to systemic HRT for the treatment of vasomotor symptoms, and do not apply to vaginal preperations with minimal systemic absorption and aimed at treating symptoms of vulvovaginal atrophy.    We briefly touched on findings of WHIMS trial published in 2005 which looked at women 65 year of age or older, and whether HRT was protective against the development of dementia.  The study revealed that HRT actually increased the risk for the development of dementia but was limited by looking only at patients 65 years of age and older.  The subsequent KEEPS trial  In 2012 which looked at HRT in recently postmenopausal women did not show any improvement in cognitive function for women on HRT.  However, there was also no significant  cognitive declines seen in recently postmenopausal women receiving HRT as had previously been shown in the WHIMS trial.    

## 2017-03-21 ENCOUNTER — Telehealth: Payer: Self-pay | Admitting: Obstetrics and Gynecology

## 2017-03-21 ENCOUNTER — Encounter: Payer: Self-pay | Admitting: Obstetrics and Gynecology

## 2017-03-21 ENCOUNTER — Other Ambulatory Visit: Payer: Self-pay | Admitting: Obstetrics and Gynecology

## 2017-03-21 LAB — NUSWAB BV AND CANDIDA, NAA
CANDIDA GLABRATA, NAA: NEGATIVE
Candida albicans, NAA: POSITIVE — AB

## 2017-03-21 MED ORDER — TERCONAZOLE 0.8 % VA CREA: 1.0000 | TOPICAL_CREAM | Freq: Every day | VAGINAL | 1 refills | Status: DC

## 2017-03-21 NOTE — Telephone Encounter (Signed)
Did you cal her?

## 2017-03-21 NOTE — Telephone Encounter (Signed)
Pt is returning missed call. Please advise °

## 2017-04-10 NOTE — Progress Notes (Signed)
error 

## 2017-06-18 ENCOUNTER — Encounter: Payer: Self-pay | Admitting: Obstetrics and Gynecology

## 2017-06-18 ENCOUNTER — Ambulatory Visit (INDEPENDENT_AMBULATORY_CARE_PROVIDER_SITE_OTHER): Payer: 59 | Admitting: Obstetrics and Gynecology

## 2017-06-18 DIAGNOSIS — Z1239 Encounter for other screening for malignant neoplasm of breast: Secondary | ICD-10-CM

## 2017-06-18 DIAGNOSIS — Z01419 Encounter for gynecological examination (general) (routine) without abnormal findings: Secondary | ICD-10-CM

## 2017-06-18 DIAGNOSIS — Z1231 Encounter for screening mammogram for malignant neoplasm of breast: Secondary | ICD-10-CM | POA: Diagnosis not present

## 2017-06-18 MED ORDER — PRASTERONE 6.5 MG VA INST: 1.0000 | VAGINAL_INSERT | Freq: Every day | VAGINAL | 11 refills | Status: DC

## 2017-06-18 NOTE — Progress Notes (Signed)
Gynecology Annual Exam  PCP: Carmon Ginsberg, [place]  Chief Complaint:  Chief Complaint  Megan Bass  . Gynecologic Exam    History of Present Illness:Megan is a 52 y.o. G1P1001 presents for annual exam. The Megan has no complaints today.   LMP: No LMP recorded. Megan has had a hysterectomy.  The Megan is sexually active. She denies dyspareunia.  The Megan does perform self breast exams.  There is notable family history of personal history of LCIS  The Megan wears seatbelts: yes.   The Megan has regular exercise: not asked.    The Megan denies current symptoms of depression.     Review of Systems: Review of Systems  Constitutional: Negative.   HENT: Negative.   Eyes: Negative.   Respiratory: Negative.   Cardiovascular: Negative.   Gastrointestinal: Negative.   Genitourinary: Negative.   Musculoskeletal: Positive for joint pain.  Skin: Negative.   Neurological: Positive for headaches. Negative for dizziness, tingling, tremors, sensory change, speech change, focal weakness, loss of consciousness and weakness.  Endo/Heme/Allergies: Negative for environmental allergies and polydipsia. Bruises/bleeds easily.  Psychiatric/Behavioral: Negative.     Past Medical History:  Past Medical History:  Diagnosis Date  . Allergy   . Anxiety   . Depression   . Malignant neoplasm of upper-outer quadrant of female breast [facility]) October 14, 2013   Right breast excision ADH./LCIS.     Past Surgical History:  Past Surgical History:  Procedure Laterality Date  . ABDOMINAL HYSTERECTOMY  02/2012  . BREAST BIOPSY Right 6.22.15   LOBULAR CARCINOMA IN SITU/ATYPICAL DUCTAL HYPERPLASIA   . BREAST BIOPSY Right 09/10/2013   fibrocystic change and pseudoangiomatous stromal  . BREAST EXCISIONAL BIOPSY Right 10/14/2013   breast ca right   . BREAST SURGERY Right 10/14/13   lumpectomy  . ENDOMETRIAL ABLATION    . ingrown toe nail  2012  . NASAL SINUS SURGERY  11/2011  .  OTHER SURGICAL HISTORY     color guard done 2018  . TUBAL LIGATION  1994   Dr.[place]    Gynecologic History:  No LMP recorded. Megan has had a hysterectomy. Last Pap: Results were: 09/08/2015 ASCUS Bass NEGATIVE high risk HPV  Last mammogram: 08/21/2016 Results were: BI-RAD II  Obstetric History: G1P1001  Family History:  Family History  Problem Relation Age of Onset  . Hypertension Mother   . COPD Father   . Hypertension Brother   . Cancer Neg Hx     Social History:  Social History   Socioeconomic History  . Marital status: Married    Spouse name: Not on file  . Number of children: Not on file  . Years of education: Not on file  . Highest education level: Not on file  Occupational History  . Not on file  Social Needs  . Financial resource strain: Not on file  . Food insecurity:    Worry: Not on file    Inability: Not on file  . Transportation needs:    Medical: Not on file    Non-medical: Not on file  Tobacco Use  . Smoking status: Never Smoker  . Smokeless tobacco: Never Used  Substance and Sexual Activity  . Alcohol use: Yes    Comment: occasionally  . Drug use: No  . Sexual activity: Yes    Birth control/protection: Surgical  Lifestyle  . Physical activity:    Days per week: 2 days    Minutes per session: 90 min  . Stress: Not at  all  Relationships  . Social connections:    Talks on phone: More than three times a week    Gets together: Twice a week    Attends religious service: Never    Active member of club or organization: Yes    Attends meetings of clubs or organizations: More than 4 times per year    Relationship status: Married  . Intimate partner violence:    Fear of current or ex partner: No    Emotionally abused: No    Physically abused: No    Forced sexual activity: No  Other Topics Concern  . Not on file  Social History Narrative  . Not on file    Allergies:  Allergies  Allergen Reactions  . Iodides Itching  . Povidone  Iodine   . Sulfa Antibiotics Rash    Medications: Prior to Admission medications   Medication Sig Start Date End Date Taking? Authorizing Provider  aspirin 81 MG tablet Take 81 mg by mouth daily.   Yes [provider]  Calcium Carbonate-Vit D-Min (CALCIUM 1200 PO) Take 1 tablet by mouth daily.   Yes [provider]  cetirizine (ZYRTEC) 10 MG tablet Take 10 mg by mouth daily.   Yes [provider]  cholecalciferol (VITAMIN D) 1000 UNITS tablet Take 1,000 Units by mouth daily.   Yes [provider]  citalopram (CELEXA) 20 MG tablet TAKE 1 TABLET BY MOUTH  DAILY 03/14/17  Yes Carmon Ginsberg, PA  loratadine (CLARITIN REDITABS) 10 MG dissolvable tablet loratadine 10 mg disintegrating tablet  Take 1 tablet every day by oral route.   Yes [provider]  Prasterone (INTRAROSA) 6.5 MG INST Place 1 capsule vaginally at bedtime. 03/18/17  Yes Malachy Mood, MD  tamoxifen (NOLVADEX) 20 MG tablet TAKE 1 TABLET BY MOUTH  EVERY DAY 01/28/17  Yes Byrnett, Forest Gleason, MD    Physical Exam Vitals: Blood pressure (!) 146/92, pulse (!) 119, height 5\' 3"  (1.6 m), weight 162 lb (73.5 kg).  General: NAD HEENT: normocephalic, anicteric Pulmonary: No increased work of breathing, CTAB Cardiovascular: RRR, distal pulses 2+ Breast: Breast symmetrical, no tenderness, no palpable nodules or masses, no skin or nipple retraction present, no nipple discharge.  No axillary or supraclavicular lymphadenopathy. Abdomen: NABS, soft, non-tender, non-distended.  Umbilicus without lesions.  No hepatomegaly, splenomegaly or masses palpable. No evidence of hernia  Genitourinary:  External: Normal external female genitalia.  Normal urethral meatus, normal Bartholin's and Skene's glands.    Vagina: Normal vaginal mucosa, no evidence of prolapse.    Cervix: Grossly normal in appearance, no bleeding  Uterus: surgically absent  Adnexa: ovaries non-enlarged, no adnexal masses  Rectal:  deferred  Lymphatic: no evidence of inguinal lymphadenopathy Extremities: no edema, erythema, or tenderness Neurologic: Grossly intact Psychiatric: mood appropriate, affect full  Female chaperone present for pelvic and breast  portions of the physical exam     Assessment: 52 y.o. G1P1001 routine annual exam  Plan: Problem List Items Addressed This Visit    None    Visit Diagnoses    Breast screening       Relevant Orders   MM DIGITAL SCREENING BILATERAL   Encounter for gynecological examination without abnormal finding          1) Mammogram - recommend yearly screening mammogram.  Mammogram Was ordered today  2) STI screening  was notoffered and therefore not obtained  3) ASCCP guidelines and rational discussed.  Status post prior supracervical hysterectomy.  Megan opts for every 3  years screening interval - repeat due 2020  4) Osteoporosis  - per USPTF routine screening DEXA at age 35 - consider early DEXA given tamoxifen use   5) Routine healthcare maintenance including cholesterol, diabetes screening discussed managed by PCP  6) Colonoscopy - being managed Bass Cologuard testing by PCP  7) Dyspareunia - good improvement in symptoms on Intrarosa, continue  8) Return in about 1 year (around 06/19/2018) for annual.    Malachy Mood, MD Mosetta Pigeon, [facility] 06/18/2017, 4:38 PM

## 2017-06-18 NOTE — Patient Instructions (Signed)
Preventive Care 40-64 Years, Female Preventive care refers to lifestyle choices and visits with your health care provider that can promote health and wellness. What does preventive care include?  A yearly physical exam. This is also called an annual well check.  Dental exams once or twice a year.  Routine eye exams. Ask your health care provider how often you should have your eyes checked.  Personal lifestyle choices, including: ? Daily care of your teeth and gums. ? Regular physical activity. ? Eating a healthy diet. ? Avoiding tobacco and drug use. ? Limiting alcohol use. ? Practicing safe sex. ? Taking low-dose aspirin daily starting at age 58. ? Taking vitamin and mineral supplements as recommended by your health care provider. What happens during an annual well check? The services and screenings done by your health care provider during your annual well check will depend on your age, overall health, lifestyle risk factors, and family history of disease. Counseling Your health care provider may ask you questions about your:  Alcohol use.  Tobacco use.  Drug use.  Emotional well-being.  Home and relationship well-being.  Sexual activity.  Eating habits.  Work and work Statistician.  Method of birth control.  Menstrual cycle.  Pregnancy history.  Screening You may have the following tests or measurements:  Height, weight, and BMI.  Blood pressure.  Lipid and cholesterol levels. These may be checked every 5 years, or more frequently if you are over 81 years old.  Skin check.  Lung cancer screening. You may have this screening every year starting at age 78 if you have a 30-pack-year history of smoking and currently smoke or have quit within the past 15 years.  Fecal occult blood test (FOBT) of the stool. You may have this test every year starting at age 65.  Flexible sigmoidoscopy or colonoscopy. You may have a sigmoidoscopy every 5 years or a colonoscopy  every 10 years starting at age 30.  Hepatitis C blood test.  Hepatitis B blood test.  Sexually transmitted disease (STD) testing.  Diabetes screening. This is done by checking your blood sugar (glucose) after you have not eaten for a while (fasting). You may have this done every 1-3 years.  Mammogram. This may be done every 1-2 years. Talk to your health care provider about when you should start having regular mammograms. This may depend on whether you have a family history of breast cancer.  BRCA-related cancer screening. This may be done if you have a family history of breast, ovarian, tubal, or peritoneal cancers.  Pelvic exam and Pap test. This may be done every 3 years starting at age 80. Starting at age 36, this may be done every 5 years if you have a Pap test in combination with an HPV test.  Bone density scan. This is done to screen for osteoporosis. You may have this scan if you are at high risk for osteoporosis.  Discuss your test results, treatment options, and if necessary, the need for more tests with your health care provider. Vaccines Your health care provider may recommend certain vaccines, such as:  Influenza vaccine. This is recommended every year.  Tetanus, diphtheria, and acellular pertussis (Tdap, Td) vaccine. You may need a Td booster every 10 years.  Varicella vaccine. You may need this if you have not been vaccinated.  Zoster vaccine. You may need this after age 5.  Measles, mumps, and rubella (MMR) vaccine. You may need at least one dose of MMR if you were born in  1957 or later. You may also need a second dose.  Pneumococcal 13-valent conjugate (PCV13) vaccine. You may need this if you have certain conditions and were not previously vaccinated.  Pneumococcal polysaccharide (PPSV23) vaccine. You may need one or two doses if you smoke cigarettes or if you have certain conditions.  Meningococcal vaccine. You may need this if you have certain  conditions.  Hepatitis A vaccine. You may need this if you have certain conditions or if you travel or work in places where you may be exposed to hepatitis A.  Hepatitis B vaccine. You may need this if you have certain conditions or if you travel or work in places where you may be exposed to hepatitis B.  Haemophilus influenzae type b (Hib) vaccine. You may need this if you have certain conditions.  Talk to your health care provider about which screenings and vaccines you need and how often you need them. This information is not intended to replace advice given to you by your health care provider. Make sure you discuss any questions you have with your health care provider. Document Released: 03/17/2015 Document Revised: 11/08/2015 Document Reviewed: 12/20/2014 Elsevier Interactive Patient Education  2018 [employer] Inc[employer]  

## 2017-07-21 ENCOUNTER — Other Ambulatory Visit: Payer: Self-pay

## 2017-07-21 DIAGNOSIS — Z1231 Encounter for screening mammogram for malignant neoplasm of breast: Secondary | ICD-10-CM

## 2017-07-24 ENCOUNTER — Encounter: Payer: Self-pay | Admitting: General Surgery

## 2017-08-17 ENCOUNTER — Other Ambulatory Visit: Payer: Self-pay | Admitting: Family Medicine

## 2017-08-17 DIAGNOSIS — F4329 Adjustment disorder with other symptoms: Secondary | ICD-10-CM

## 2017-08-22 ENCOUNTER — Ambulatory Visit
Admission: RE | Admit: 2017-08-22 | Discharge: 2017-08-22 | Disposition: A | Discharge status: Home / Self Care | Payer: 59 | Source: Ambulatory Visit | Attending: General Surgery | Admitting: General Surgery

## 2017-08-22 DIAGNOSIS — Z1231 Encounter for screening mammogram for malignant neoplasm of breast: Secondary | ICD-10-CM | POA: Diagnosis not present

## 2017-09-09 ENCOUNTER — Ambulatory Visit (INDEPENDENT_AMBULATORY_CARE_PROVIDER_SITE_OTHER): Payer: 59 | Admitting: General Surgery

## 2017-09-09 ENCOUNTER — Encounter: Payer: Self-pay | Admitting: General Surgery

## 2017-09-09 VITALS — BP 128/78 | HR 70 | Resp 12 | Ht 63.0 in | Wt 163.0 lb

## 2017-09-09 DIAGNOSIS — D0501 Lobular carcinoma in situ of right breast: Secondary | ICD-10-CM

## 2017-09-09 DIAGNOSIS — Z780 Asymptomatic menopausal state: Secondary | ICD-10-CM

## 2017-09-09 NOTE — Patient Instructions (Addendum)
The patient is aware to call back for any questions or concerns.  Continue tamoxifen until August next year, discussed stopping early to see if symptoms improve.She will send a MyChart message if she decides to stop.  Schedule Bone density

## 2017-09-09 NOTE — Progress Notes (Signed)
Patient ID: Megan Bass, female   DOB: [birth year], 52 y.o.   MRN: [id number]  Chief Complaint  Patient presents with  . Follow-up    HPI Megan Bass is a 52 y.o. female who presents for her follow up breast cancer and a breast evaluation. The most recent mammogram was done on 08/22/2017 .  Patient does perform regular self breast checks and gets regular mammograms done.  No new breast issues. Tolerating tamoxifen, night sweats are controlled with the compound ergo/bellad/pheno capsule. She admits to increased memory loss and joint aches over the past 6 months. She is here with her daughter, [name].  HPI  Past Medical History:  Diagnosis Date  . Allergy   . Anxiety   . Depression   . Malignant neoplasm of upper-outer quadrant of female breast [facility]) October 14, 2013   Right breast excision ADH./LCIS.     Past Surgical History:  Procedure Laterality Date  . ABDOMINAL HYSTERECTOMY  02/2012  . BREAST BIOPSY Right 6.22.15   LOBULAR CARCINOMA IN SITU/ATYPICAL DUCTAL HYPERPLASIA   . BREAST BIOPSY Right 09/10/2013   fibrocystic change and pseudoangiomatous stromal  . BREAST EXCISIONAL BIOPSY Right 10/14/2013   breast ca right   . BREAST LUMPECTOMY Right 2015  . BREAST SURGERY Right 10/14/13   lumpectomy  . ENDOMETRIAL ABLATION    . ingrown toe nail  2012  . NASAL SINUS SURGERY  11/2011  . OTHER SURGICAL HISTORY     color guard done 2018  . TUBAL LIGATION  1994   Dr.Washington    Family History  Problem Relation Age of Onset  . Hypertension Mother   . COPD Father   . Hypertension Brother   . Cancer Neg Hx     Social History Social History   Tobacco Use  . Smoking status: Never Smoker  . Smokeless tobacco: Never Used  Substance Use Topics  . Alcohol use: Yes    Comment: occasionally  . Drug use: No    Allergies  Allergen Reactions  . Iodides Itching  . Povidone Iodine   . Sulfa Antibiotics Rash    Current Outpatient Medications  Medication Sig  Dispense Refill  . aspirin 81 MG tablet Take 81 mg by mouth daily.    . Calcium Carbonate-Vit D-Min (CALCIUM 1200 PO) Take 1 tablet by mouth daily.    . cholecalciferol (VITAMIN D) 1000 UNITS tablet Take 1,000 Units by mouth daily.    . citalopram (CELEXA) 20 MG tablet TAKE 1 TABLET BY MOUTH  DAILY 90 tablet 0  . ERGOTAMINE-PB-BELLADONNA PO Take by mouth 2 (two) times daily.    Marland Kitchen loratadine (CLARITIN REDITABS) 10 MG dissolvable tablet loratadine 10 mg disintegrating tablet  Take 1 tablet every day by oral route.    . Prasterone (INTRAROSA) 6.5 MG INST Place 1 capsule vaginally at bedtime. 28 each 11  . tamoxifen (NOLVADEX) 20 MG tablet TAKE 1 TABLET BY MOUTH  EVERY DAY 90 tablet 3   No current facility-administered medications for this visit.     Review of Systems Review of Systems  Constitutional: Negative.   Respiratory: Negative.   Cardiovascular: Negative.     Blood pressure 128/78, pulse 70, resp. rate 12, height 5\' 3"  (1.6 m), weight 163 lb (73.9 kg), SpO2 98 %.  Physical Exam Physical Exam  Constitutional: She is oriented to person, place, and time. She appears well-developed and well-nourished.  HENT:  Mouth/Throat: Oropharynx is clear and moist.  Eyes: Conjunctivae are normal. No scleral icterus.  Neck: Neck supple.  Cardiovascular: Normal rate, regular rhythm and normal heart sounds.  Pulmonary/Chest: Effort normal and breath sounds normal. Right breast exhibits no inverted nipple, no mass, no nipple discharge, no skin change and no tenderness. Left breast exhibits no inverted nipple, no mass, no nipple discharge, no skin change and no tenderness.  Right breast incision well healed.    Lymphadenopathy:    She has no cervical adenopathy.    She has no axillary adenopathy.  Neurological: She is alert and oriented to person, place, and time.  Skin: Skin is warm and dry.  Psychiatric: Her behavior is normal.    Data Reviewed Bilateral mammograms dated August 22, 2017  were reviewed.  No interval change.  BI-RADS-1.  Assessment    No evidence of recurrent disease involving either breast.    Plan  Indications for a holiday off tamoxifen to see if her various symptoms including memory loss and muscle/joint aches resolve was discussed.  Very low risk of holiday, especially if this identifies that her symptoms are not related to ongoing tamoxifen therapy.  If her symptoms improve could consider change to an aromatase inhibitor as she is likely postmenopausal.  With almost 4 years of therapy completed, certainly strong consideration could be given to cessation of antiestrogen therapy. Continue tamoxifen until August next year, discussed stopping early to see if symptoms improve. She will send a MyChart message if she decides to stop. Schedule Bone density   HPI, Physical Exam, Assessment and Plan have been scribed under the direction and in the presence of Robert Bellow, MD. Karie Fetch, RN  I have completed the exam and reviewed the above documentation for accuracy and completeness.  I agree with the above.  Haematologist has been used and any errors in dictation or transcription are unintentional.  Hervey Ard, M.D., F.A.C.S.  Forest Gleason Dennis Hegeman 09/09/2017, 8:52 PM

## 2017-09-10 ENCOUNTER — Encounter: Payer: Self-pay | Admitting: General Surgery

## 2017-09-17 ENCOUNTER — Ambulatory Visit
Admission: RE | Admit: 2017-09-17 | Discharge: 2017-09-17 | Disposition: A | Discharge status: Home / Self Care | Payer: 59 | Source: Ambulatory Visit | Attending: General Surgery | Admitting: General Surgery

## 2017-09-17 DIAGNOSIS — M85851 Other specified disorders of bone density and structure, right thigh: Secondary | ICD-10-CM | POA: Diagnosis not present

## 2017-09-17 DIAGNOSIS — Z780 Asymptomatic menopausal state: Secondary | ICD-10-CM | POA: Diagnosis present

## 2017-09-17 DIAGNOSIS — D0501 Lobular carcinoma in situ of right breast: Secondary | ICD-10-CM | POA: Diagnosis not present

## 2017-09-30 DIAGNOSIS — J301 Allergic rhinitis due to pollen: Secondary | ICD-10-CM | POA: Diagnosis not present

## 2017-09-30 DIAGNOSIS — J328 Other chronic sinusitis: Secondary | ICD-10-CM | POA: Diagnosis not present

## 2017-10-01 ENCOUNTER — Encounter: Payer: Self-pay | Admitting: Family Medicine

## 2017-10-02 ENCOUNTER — Other Ambulatory Visit: Payer: Self-pay | Admitting: Family Medicine

## 2017-10-16 ENCOUNTER — Encounter: Payer: Self-pay | Admitting: General Surgery

## 2017-10-28 ENCOUNTER — Other Ambulatory Visit: Payer: Self-pay | Admitting: Family Medicine

## 2017-10-28 DIAGNOSIS — F4329 Adjustment disorder with other symptoms: Secondary | ICD-10-CM

## 2017-12-13 DIAGNOSIS — Z23 Encounter for immunization: Secondary | ICD-10-CM | POA: Diagnosis not present

## 2017-12-23 ENCOUNTER — Other Ambulatory Visit: Payer: Self-pay | Admitting: Family Medicine

## 2017-12-23 DIAGNOSIS — F4329 Adjustment disorder with other symptoms: Secondary | ICD-10-CM

## 2018-02-06 ENCOUNTER — Encounter: Payer: Self-pay | Admitting: Family Medicine

## 2018-02-06 ENCOUNTER — Other Ambulatory Visit: Payer: Self-pay

## 2018-02-06 ENCOUNTER — Ambulatory Visit (INDEPENDENT_AMBULATORY_CARE_PROVIDER_SITE_OTHER): Payer: 59 | Admitting: Family Medicine

## 2018-02-06 VITALS — BP 130/88 | HR 86 | Temp 98.1°F | Ht 63.0 in | Wt 176.0 lb

## 2018-02-06 DIAGNOSIS — J0100 Acute maxillary sinusitis, unspecified: Secondary | ICD-10-CM | POA: Diagnosis not present

## 2018-02-06 DIAGNOSIS — R6889 Other general symptoms and signs: Secondary | ICD-10-CM | POA: Diagnosis not present

## 2018-02-06 DIAGNOSIS — B349 Viral infection, unspecified: Secondary | ICD-10-CM | POA: Diagnosis not present

## 2018-02-06 LAB — POCT INFLUENZA A/B
Influenza A, POC: NEGATIVE
Influenza B, POC: NEGATIVE

## 2018-02-06 MED ORDER — AMOXICILLIN-POT CLAVULANATE 875-125 MG PO TABS: 1.0000 | ORAL_TABLET | Freq: Two times a day (BID) | ORAL | 0 refills | Status: DC

## 2018-02-06 MED ORDER — HYDROCODONE-HOMATROPINE 5-1.5 MG/5ML PO SYRP: 5.0000 mL | ORAL_SOLUTION | Freq: Four times a day (QID) | ORAL | 0 refills | Status: AC | PRN

## 2018-02-06 NOTE — Addendum Note (Signed)
Addended by: Edd Arbour B on: 02/06/2018 10:44 AM   Modules accepted: Orders

## 2018-02-06 NOTE — Patient Instructions (Signed)
Continue otc cold preparation.

## 2018-02-06 NOTE — Progress Notes (Signed)
  Subjective:     Patient ID: Megan Bass, female   DOB: 17-Feb-1966, 52 y.o.   MRN: [id number] Chief Complaint  Patient presents with  . Nasal Congestion    started on 01/31/18.  mucus green color, cough, headache, body aches, fever.  pt taking mucinex severe cold max strength  . Sore Throat   HPI Developed viral sx one week ago. + flu shot. Patient reports increased sinus pressure and purulent sinus drainage.  Review of Systems     Objective:   Physical Exam  Constitutional: She appears well-developed and well-nourished. She appears ill.  Ears: T.M's intact without inflammation Sinuses: mild maxillary sinus tenderness Throat: no tonsillar enlargement or exudate Neck: anterior cervical tenderness Lungs: clear     Assessment:    1. Acute viral syndrome - HYDROcodone-homatropine (HYCODAN) 5-1.5 MG/5ML syrup; Take 5 mLs by mouth every 6 (six) hours as needed for up to 5 days. 5 ml 4-6 hours as needed for cough  Dispense: 100 mL; Refill: 0  2. Acute non-recurrent maxillary sinusitis - amoxicillin-clavulanate (AUGMENTIN) 875-125 MG tablet; Take 1 tablet by mouth 2 (two) times daily.  Dispense: 20 tablet; Refill: 0    Plan:    work excuse for 12/2-12/6. Continue otc cold preparation Call if not improving over the next few days.

## 2018-03-02 ENCOUNTER — Encounter: Payer: Self-pay | Admitting: Family Medicine

## 2018-03-02 ENCOUNTER — Other Ambulatory Visit: Payer: Self-pay | Admitting: Family Medicine

## 2018-03-18 ENCOUNTER — Other Ambulatory Visit: Payer: Self-pay | Admitting: Family Medicine

## 2018-03-18 DIAGNOSIS — F4329 Adjustment disorder with other symptoms: Secondary | ICD-10-CM

## 2018-04-28 ENCOUNTER — Ambulatory Visit (INDEPENDENT_AMBULATORY_CARE_PROVIDER_SITE_OTHER): Payer: 59 | Admitting: Family Medicine

## 2018-04-28 ENCOUNTER — Other Ambulatory Visit: Payer: Self-pay | Admitting: Family Medicine

## 2018-04-28 ENCOUNTER — Encounter: Payer: Self-pay | Admitting: Family Medicine

## 2018-04-28 VITALS — BP 130/90 | HR 64 | Temp 97.5°F | Resp 15 | Ht 63.0 in | Wt 180.6 lb

## 2018-04-28 DIAGNOSIS — M7701 Medial epicondylitis, right elbow: Secondary | ICD-10-CM | POA: Diagnosis not present

## 2018-04-28 DIAGNOSIS — Z131 Encounter for screening for diabetes mellitus: Secondary | ICD-10-CM

## 2018-04-28 DIAGNOSIS — Z13220 Encounter for screening for lipoid disorders: Secondary | ICD-10-CM

## 2018-04-28 DIAGNOSIS — R030 Elevated blood-pressure reading, without diagnosis of hypertension: Secondary | ICD-10-CM

## 2018-04-28 DIAGNOSIS — Z0000 Encounter for general adult medical examination without abnormal findings: Secondary | ICD-10-CM | POA: Diagnosis not present

## 2018-04-28 DIAGNOSIS — G4733 Obstructive sleep apnea (adult) (pediatric): Secondary | ICD-10-CM

## 2018-04-28 DIAGNOSIS — F439 Reaction to severe stress, unspecified: Secondary | ICD-10-CM | POA: Diagnosis not present

## 2018-04-28 NOTE — Progress Notes (Signed)
Subjective:     Patient ID: Megan Bass, female   DOB: 1965/05/10, 53 y.o.   MRN: [id number] Chief Complaint  Patient presents with  . Annual Exam    Patient comes in office today for her annual physical, patient reports that she is feeling fairly well today but has issues to address. Patient reports that she follows a poor diet, and reports 16-48mi a week. Patient reports sleep habits is very poor. Patients last reported Tdap 05/24/16, BMD 09/17/17, Mammo 08/22/17, Cologuard 07/02/16,   . Hypertension    Patient would like to address elevated blood pressure readings outside the office that are >145/90. Patient reports that she has been under a lot of stress and taken on new roll at work.   . Sleep Apnea    Patient would like to address poor sleep patterns at night and states that she is feeling fatigued during the day. Patient reports today that she needs prescription for new materials for C-pap machine.  . Stress    Patient states that she would like to address change disorder, paitent reports that she is currently on Celexa and in the past month states that family/friends have noticed a change in her mood that appears to be negative.  . Elbow Pain    Patient reports pain in her right elbow for the past 6 months. Patient reports that she has pain at rest and when extending arm. Patient denies swelling or previous injury,patient is taking otc Aleve for relief.    HPI She has increased stress at work due to major increased job responsibilities. 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 She reports a maternal history of hypertension. Reports she was diagnosed with sleep apnea approximately 10 years ago but has not used the equipment for the last year as it is broken.  Review of Systems General: She is no longer on tamoxifen and is now on annual mammogram surveillance for breast  cancer. HEENT: regular dental visits and eye exams (glasses) Cardiovascular: no chest pain, shortness of breath, or palpitations. Reports chest tightness when stressed GI: Heartburn controlled with omeprazole, no change in bowel habits or blood in the stool GU:  no change in bladder habits  Psychiatric: not depressed Musculoskeletal: Right medal elbow pain which is associated with heavy use of a computer mouse at work    Objective:   Physical Exam Constitutional:      General: She is not in acute distress. Neurological:     Mental Status: She is alert.   Eyes: PERRLA, EOMI Neck: no thyromegaly, tenderness or nodules, no cervical adenopathy or carotid bruits. ENT: TM's intact without inflammation; No tonsillar enlargement or exudate, Lungs: Clear Heart : RRR without murmur or gallop Abd: bowel sounds present, soft, non-tender, no organomegaly Extremities: no edema, Tender @ right medial epicondyle      Assessment:    1. Annual physical exam  2. Situational stress: will increased current supply of citalopram to 40 mg daily.  3. OSA (obstructive sleep apnea): may need to update sleep study  4. Medial epicondylitis, right elbow - Ambulatory referral to Orthopedic Surgery  5. Screening for cholesterol level - Lipid panel  6. Screening for diabetes mellitus - Comprehensive metabolic panel  7. Blood pressure elevated without history of HTN: monitor as stress better managed.    Plan:   Further f/u pending lab results. She will establish with a new PCP due  to my retirement

## 2018-04-28 NOTE — Patient Instructions (Addendum)
We call you with the lab results. Do establish with Megan Bass. Increase your citalopram with your supply at hand to two pills daily.Marland Kitchen

## 2018-05-29 ENCOUNTER — Ambulatory Visit: Payer: Self-pay | Admitting: Physician Assistant

## 2018-06-05 ENCOUNTER — Ambulatory Visit (INDEPENDENT_AMBULATORY_CARE_PROVIDER_SITE_OTHER): Payer: 59 | Admitting: Physician Assistant

## 2018-06-05 DIAGNOSIS — G4730 Sleep apnea, unspecified: Secondary | ICD-10-CM

## 2018-06-05 DIAGNOSIS — F4329 Adjustment disorder with other symptoms: Secondary | ICD-10-CM

## 2018-06-05 DIAGNOSIS — N6091 Unspecified benign mammary dysplasia of right breast: Secondary | ICD-10-CM

## 2018-06-05 DIAGNOSIS — D0501 Lobular carcinoma in situ of right breast: Secondary | ICD-10-CM

## 2018-06-05 DIAGNOSIS — N959 Unspecified menopausal and perimenopausal disorder: Secondary | ICD-10-CM | POA: Diagnosis not present

## 2018-06-05 MED ORDER — CITALOPRAM HYDROBROMIDE 40 MG PO TABS: 40.0000 mg | ORAL_TABLET | Freq: Every day | ORAL | 1 refills | Status: DC

## 2018-06-05 NOTE — Progress Notes (Signed)
Subjective:    Patient ID: DENNA Bass, female    DOB: 1965-07-29, 53 y.o.   MRN: [id number]  Megan Bass is a 53 y.o. female presenting on 06/05/2018 for Anxiety and Breast Cancer  Virtual Visit via Video Note  I connected with Megan Bass on 06/17/18 at  2:40 PM EDT by a video enabled telemedicine application and verified that I am speaking with the correct person using two identifiers.   I discussed the limitations of evaluation and management by telemedicine and the availability of in person appointments. The patient expressed understanding and agreed to proceed.  HPI   Patient previously saw Mariel Sleet, [name] who has since retired and she is presenting today to establish care. Her daughter is a patient at this clinic as well. She works as an Metallurgist.   Anxiety and depression: Last visit, celexa increased to 40 mg daily which she reports is working well for her.  Allergies: takign zyrtec, flonase.   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 Underwent right wide breast excision in 2015 and then took Tamoxifen for at least 4 years, currently on drug holiday. She gest routine mammograms. Currently followed by gynecology and receives supplements for menopausal symptoms.  Hysterectomy: performed in 2011 for dysfunctional uterine bleeding and she still has her ovaries but does not have a cervix.   Elevated BP medication: Brother with high blood pressure, mother with high blood pressure in 50's. New job one year ago and blood pressure increases during her work. Runs 130's/80s normally and will be 145/95 at night. Hasn't taken anything for this.  BP Readings from Last 3 Encounters:  04/28/18 130/90  02/06/18 130/88  09/09/17 128/78   Sleep Apnea: Hasn't had sleep study for 10+ years, underwent sinus surgery for  deviated septum and hasn't had as much of an issue for this. Hasn't been using CPAP machine for 6-8 months and feels she is sleeping well.  Colon cancer screening: cologuard normal 2018 due in 2021   Social History   Tobacco Use  . Smoking status: Never Smoker  . Smokeless tobacco: Never Used  Substance Use Topics  . Alcohol use: Yes    Comment: occasionally  . Drug use: No    Review of Systems Per HPI unless specifically indicated above     Objective:    There were no vitals taken for this visit.  Wt Readings from Last 3 Encounters:  04/28/18 180 lb 9.6 oz (81.9 kg)  02/06/18 176 lb (79.8 kg)  09/09/17 163 lb (73.9 kg)    Physical Exam Constitutional:      Appearance: Normal appearance.  Cardiovascular:     Rate and Rhythm: Normal rate.  Pulmonary:     Effort: Pulmonary effort is normal. No respiratory distress.  Neurological:     Mental Status: She is alert. Mental status is at baseline.  Psychiatric:        Mood and Affect: Mood normal.        Behavior: Behavior normal.    Results for orders placed or performed in visit on 02/06/18  POCT Influenza A/B  Result Value Ref Range   Influenza A, POC Negative Negative   Influenza B, POC Negative Negative      Assessment & Plan:  1. Adjustment disorder with other symptom  - citalopram (CELEXA) 40 MG tablet; Take 1 tablet (40 mg total)  by mouth daily.  Dispense: 90 tablet; Refill: 1  2. Atypical ductal hyperplasia of right breast  Gets yearly mammograms, currently on Tamoxifen holiday.  3. Neoplasm of right breast, primary tumor staging category Tis: lobular carcinoma in situ (LCIS)   4. Menopausal disorder   5. Sleep apnea, unspecified type  Not using CPAP as symptoms have improved since sinus surgery. Advised she may need to repeat sleep study if she needs updated CPAP rx.      Follow up plan: Return in about 1 year (around 06/05/2019).  Carles Collet, [name] [facility] 06/17/2018, 12:58 PM

## 2018-06-17 DIAGNOSIS — G4730 Sleep apnea, unspecified: Secondary | ICD-10-CM | POA: Insufficient documentation

## 2018-06-17 NOTE — Patient Instructions (Signed)
Lobular Carcinoma In Situ  Lobular carcinoma in situ is the presence of abnormal cells in the breast. These abnormal cells are located in the glands that produce milk (lobules), and the cells have not spread to other areas. Lobular carcinoma in situ is not a form of cancer. However, having lobular carcinoma in situ increases your risk of developing breast cancer in either breast. What are the causes? The exact cause of lobular carcinoma in situ is not known. What increases the risk? The risk for lobular carcinoma in situ increases in women who are over age 47. You are also more likely to develop this condition if you:  Have a family history of breast cancer.  Have used or currently use hormones, such as estrogen, progesterone, or birth control pills.  Have certain genes that are passed from parent to child (inherited). What are the signs or symptoms? Lobular carcinoma in situ does not cause any symptoms. How is this diagnosed? Lobular carcinoma in situ is diagnosed with a test called a breast biopsy. During a breast biopsy, a sample of breast tissue is removed and then examined under a microscope. How is this treated? Treatment for this condition may include:  More frequent monitoring of breast health. You may have regular: ? Clinical breast exams. ? Mammograms. ? Ultrasounds. ? MRIs.  Medicines to keep the abnormal cells from spreading and becoming cancerous.  Lumpectomy. This is surgery to remove the area of abnormal cells, along with normal tissue in the surrounding area. This may also be called breast-conserving surgery.  Preventive (prophylactic) mastectomy. This is surgery to remove both breasts. The type of mastectomy done may include removing breast tissue, the nipple, and the circle of colored tissue around the nipple (areola). This is usually done only if you have a very high risk of developing breast cancer. You may choose to have breast reconstruction surgery to restore the  shape of your breast after a mastectomy. Follow these instructions at home:  Take over-the-counter and prescription medicines only as told by your health care provider.  Limit alcohol intake to no more than 1 drink a day for nonpregnant women and 2 drinks a day for men. One drink equals 12 oz of beer, 5 oz of wine, or 1 oz of hard liquor.  Do not use any products that contain nicotine or tobacco, such as cigarettes and e-cigarettes. If you need help quitting, ask your health care provider.  Eat a healthy diet. A healthy diet includes lots of fruits and vegetables, low-fat dairy products, lean meats, and fiber. ? Make sure half your plate is filled with fruits or vegetables. ? Choose high-fiber foods such as whole-grain breads and cereals.  Keep all follow-up visits as told by your health care provider. This is important. Contact a health care provider if:  You have a fever.  You have any symptoms or changes that concern you.  You notice new fatigue or weakness. Get help right away if:  You have chest pain or trouble breathing.  You faint. Summary  Lobular carcinoma in situ is the presence of abnormal cells in the breast.  Lobular carcinoma in situ is not a form of cancer. However, having lobular carcinoma in situ increases your risk of developing breast cancer in either breast.  Treatment for this condition may include frequent monitoring of breast health, medicines, or surgery. This information is not intended to replace advice given to you by your health care provider. Make sure you discuss any questions you have with  your health care provider. Document Released: 09/15/2013 Document Revised: 12/12/2016 Document Reviewed: 12/12/2016 Elsevier Interactive Patient Education  2019 [employer].

## 2018-07-31 ENCOUNTER — Emergency Department: Payer: 59

## 2018-07-31 ENCOUNTER — Encounter: Payer: Self-pay | Admitting: Emergency Medicine

## 2018-07-31 ENCOUNTER — Other Ambulatory Visit: Payer: Self-pay

## 2018-07-31 ENCOUNTER — Emergency Department
Admission: EM | Admit: 2018-07-31 | Discharge: 2018-07-31 | Disposition: A | Discharge status: Home / Self Care | Payer: 59 | Attending: Emergency Medicine | Admitting: Emergency Medicine

## 2018-07-31 DIAGNOSIS — J0100 Acute maxillary sinusitis, unspecified: Secondary | ICD-10-CM

## 2018-07-31 DIAGNOSIS — Z79899 Other long term (current) drug therapy: Secondary | ICD-10-CM | POA: Diagnosis not present

## 2018-07-31 DIAGNOSIS — R040 Epistaxis: Secondary | ICD-10-CM

## 2018-07-31 DIAGNOSIS — Z853 Personal history of malignant neoplasm of breast: Secondary | ICD-10-CM | POA: Diagnosis not present

## 2018-07-31 MED ORDER — PREDNISONE 10 MG PO TABS: 30.0000 mg | ORAL_TABLET | Freq: Every day | ORAL | 0 refills | Status: DC

## 2018-07-31 MED ORDER — AMOXICILLIN 875 MG PO TABS: 875.0000 mg | ORAL_TABLET | Freq: Two times a day (BID) | ORAL | 0 refills | Status: DC

## 2018-07-31 MED ORDER — FLUCONAZOLE 150 MG PO TABS: ORAL_TABLET | ORAL | 0 refills | Status: DC

## 2018-07-31 NOTE — ED Triage Notes (Signed)
Patient states she had a nosebleed yesterday around 5pm.  Patient states this morning she continued to have sinus drainage and continued to spit some blood.  Patient states she blew her nose this morning and no blood is coming out of her nose but blood is still dripping down the back of her throat.  Patient denies history of hypertension.  Patient denies history of nosebleeds.

## 2018-07-31 NOTE — Discharge Instructions (Addendum)
Follow-up with your regular ENT or Dr. Pryor Ochoa if you have continued nosebleeds.  If you feel that you are having a posterior nosebleed please return emergency department immediately.  Take medications as prescribed.

## 2018-07-31 NOTE — ED Notes (Signed)
See triage note  Presents with nose bleed  States she had a nose bleed yesterday which lasted approx 20 mins.  Then this am while bending to wash her hair in the shower she had another nose bleed  No blood noted at present but states feels like it is her throat

## 2018-07-31 NOTE — ED Provider Notes (Signed)
[facility] Emergency Department Provider Note  ____________________________________________   First MD Initiated Contact with Patient 07/31/18 [phone]     (approximate)  I have reviewed the triage vital signs and the nursing notes.   HISTORY  Chief Complaint Epistaxis    HPI Megan Bass is a 53 y.o. female presents emergency department complaining of a nosebleed yesterday which lasted approximately 20 minutes.  She states that when she was in the shower this morning she had another nosebleed.  She states that she leaned over and a large clot came out.  No further bleeding was noted.    Past Medical History:  Diagnosis Date  . Allergy   . Anxiety   . Depression   . Malignant neoplasm of upper-outer quadrant of female breast [facility]) October 14, 2013   Right breast excision ADH./LCIS.     Patient Active Problem List   Diagnosis Date Noted  . Sleep apnea 06/17/2018  . 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 Intractable migraine with aura without status migrainosus 09/27/2016  . Arthritis 01/18/2016  . 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 Adaptation reaction 09/04/2015  . Vaginal atrophy 08/22/2015  . Menopausal disorder 08/16/2014  . 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 Breast neoplasm, Tis (LCIS) 09/02/2013    Past Surgical History:  Procedure Laterality Date  . ABDOMINAL HYSTERECTOMY  02/2012  . BREAST BIOPSY Right 6.22.15   LOBULAR CARCINOMA IN SITU/ATYPICAL DUCTAL HYPERPLASIA   . BREAST BIOPSY Right 09/10/2013   fibrocystic change and pseudoangiomatous stromal  . BREAST EXCISIONAL BIOPSY Right 10/14/2013   breast ca right   . BREAST LUMPECTOMY Right 2015  . BREAST SURGERY Right 10/14/13   lumpectomy  . ENDOMETRIAL ABLATION    . ingrown toe nail  2012  . NASAL SINUS SURGERY  11/2011  . OTHER SURGICAL HISTORY     color guard done 2018  . TUBAL LIGATION  1994   Dr.[place]     Prior to Admission medications   Medication Sig Start Date End Date Taking? Authorizing Provider  amoxicillin (AMOXIL) 875 MG tablet Take 1 tablet (875 mg total) by mouth 2 (two) times daily. 07/31/18   Fread Kottke, Linden Dolin, [name]  Calcium Carbonate-Vit D-Min (CALCIUM 1200 PO) Take 1 tablet by mouth daily.    [provider]  cetirizine (ZYRTEC) 10 MG tablet TAKE 1 TABLET BY MOUTH EACH MORNING AS NEEDED FOR ALLERGIES 01/29/18   [provider]  cholecalciferol (VITAMIN D) 1000 UNITS tablet Take 1,000 Units by mouth daily.    [provider]  citalopram (CELEXA) 40 MG tablet Take 1 tablet (40 mg total) by mouth daily. 06/05/18 12/02/18  Trinna Post, [name]  ERGOTAMINE-PB-BELLADONNA PO Take by mouth 2 (two) times daily.    [provider]  fluconazole (DIFLUCAN) 150 MG tablet Take one now and one in a week 07/31/18   Caryn Section, Linden Dolin, [name]  predniSONE (DELTASONE) 10 MG tablet Take 3 tablets (30 mg total) by mouth daily with breakfast. 07/31/18   Caryn Section Linden Dolin, [name]  triamcinolone (NASACORT ALLERGY 24HR) 55 MCG/ACT AERO nasal inhaler Place 2 sprays into the nose daily.    [provider]    Allergies Iodides; Povidone iodine; and Sulfa antibiotics  Family History  Problem Relation Age of Onset  . Hypertension Mother   . COPD Father   . Hypertension Brother   . Cancer Neg Hx     Social History Social History  Tobacco Use  . Smoking status: Never Smoker  . Smokeless tobacco: Never Used  Substance Use Topics  . Alcohol use: Yes    Alcohol/week: 6.0 standard drinks    Types: 6 Glasses of wine per week  . Drug use: No    Review of Systems  Constitutional: No fever/chills Eyes: No visual changes. ENT: No sore throat.  Positive nosebleed yesterday Respiratory: Denies cough Genitourinary: Negative for dysuria. Musculoskeletal: Negative for back pain. Skin: Negative for rash.    ____________________________________________   PHYSICAL  EXAM:  VITAL SIGNS: ED Triage Vitals  Enc Vitals Group     BP 07/31/18 0731 (!) 148/96     Pulse Rate 07/31/18 0731 72     Resp 07/31/18 0731 18     Temp 07/31/18 0731 98.8 F (37.1 C)     Temp Source 07/31/18 0731 Oral     SpO2 07/31/18 0731 98 %     Weight 07/31/18 0726 181 lb (82.1 kg)     Height 07/31/18 0726 5\' 3"  (1.6 m)     Head Circumference --      [facility] Flow --      Pain Score 07/31/18 0725 1     Pain Loc --      Pain Edu? --      Excl. in [place]? --     Constitutional: Alert and oriented. Well appearing and in no acute distress. Eyes: Conjunctivae are normal.  Head: Atraumatic. Nose: No congestion/rhinnorhea.  No active bleeding noted, nasal mucosa is very swollen, no clots noted Mouth/Throat: Mucous membranes are moist.  Tonsils are swollen and there is very little space noted in the posterior of the throat, no active bleeding is noted, no clots noted Neck:  supple no lymphadenopathy noted Cardiovascular: Normal rate, regular rhythm. Heart sounds are normal Respiratory: Normal respiratory effort.  No retractions, lungs c t a  GU: deferred Musculoskeletal: FROM all extremities, warm and well perfused Neurologic:  Normal speech and language.  Skin:  Skin is warm, dry and intact. No rash noted. Psychiatric: Mood and affect are normal. Speech and behavior are normal.  ____________________________________________   LABS (all labs ordered are listed, but only abnormal results are displayed)  Labs Reviewed - No data to display ____________________________________________   ____________________________________________  RADIOLOGY  CT maxillofacial shows retention cyst  ____________________________________________   PROCEDURES  Procedure(s) performed: No  Procedures    ____________________________________________   INITIAL IMPRESSION / ASSESSMENT AND PLAN / ED COURSE  Pertinent labs & imaging results that were available during my care of the patient  were reviewed by me and considered in my medical decision making (see chart for details).   Patient is a 53 year old female presents emergency department with complaints of a nosebleed yesterday and earlier today she feels that she got blood from the posterior area.  She has a lot of sinus drainage and used her Nasacort.  She states everything feels very dry and irritated.  She denies any fever or chills.  No loss of smell.  Physical exam patient appears well.  No active bleeding is noted.  There is inflammation noted.  CT maxillofacial shows a retention cyst.  Explained the findings to the patient.  Due to the physical presentation she was placed on amoxicillin, prednisone 30 mg daily for 3 days, and Diflucan.  She is to follow-up with [facility] if not better in 2 to 3 days.  Return emergency department she feels that she is developing a posterior nosebleed which we discussed  in length.  She states she understands and will comply.  She was discharged in stable condition.  SHELSY SENG was evaluated in Emergency Department on 07/31/2018 for the symptoms described in the history of present illness. She was evaluated in the context of the global COVID-19 pandemic, which necessitated consideration that the patient might be at risk for infection with the SARS-CoV-2 virus that causes COVID-19. Institutional protocols and algorithms that pertain to the evaluation of patients at risk for COVID-19 are in a state of rapid change based on information released by regulatory bodies including the CDC and federal and state organizations. These policies and algorithms were followed during the patient's care in the ED.      As part of my medical decision making, I reviewed the following data within the [facility] notes reviewed and incorporated, Old chart reviewed, Radiograph reviewed CT maxillofacial is negative, Notes from prior ED visits and [place] Controlled Substance Database   ____________________________________________   FINAL CLINICAL IMPRESSION(S) / ED DIAGNOSES  Final diagnoses:  Acute maxillary sinusitis, recurrence not specified  Acute anterior epistaxis      NEW MEDICATIONS STARTED DURING THIS VISIT:  New Prescriptions   AMOXICILLIN (AMOXIL) 875 MG TABLET    Take 1 tablet (875 mg total) by mouth 2 (two) times daily.   FLUCONAZOLE (DIFLUCAN) 150 MG TABLET    Take one now and one in a week   PREDNISONE (DELTASONE) 10 MG TABLET    Take 3 tablets (30 mg total) by mouth daily with breakfast.     Note:  This document was prepared using Dragon voice recognition software and may include unintentional dictation errors.    Versie Starks, [name] 07/31/18 [place], [place], MD 07/31/18 [phone]

## 2018-08-06 ENCOUNTER — Emergency Department
Admission: EM | Admit: 2018-08-06 | Discharge: 2018-08-06 | Disposition: A | Discharge status: Home / Self Care | Payer: 59 | Attending: Emergency Medicine | Admitting: Emergency Medicine

## 2018-08-06 ENCOUNTER — Other Ambulatory Visit: Payer: Self-pay

## 2018-08-06 ENCOUNTER — Encounter: Payer: Self-pay | Admitting: Emergency Medicine

## 2018-08-06 DIAGNOSIS — Z5321 Procedure and treatment not carried out due to patient leaving prior to being seen by health care provider: Secondary | ICD-10-CM | POA: Diagnosis not present

## 2018-08-06 DIAGNOSIS — R040 Epistaxis: Secondary | ICD-10-CM | POA: Insufficient documentation

## 2018-08-06 NOTE — ED Notes (Signed)
Pt reports no further bleeding, going home now; instr to return for any new or worsening symptoms

## 2018-08-06 NOTE — ED Triage Notes (Addendum)
Patient ambulatory to triage with steady gait, without difficulty or distress noted; pt reports left nare bleeding x 3 hrs; no accomp symptoms; seen here Friday for same and f/u with ENT on Monday, with no abnormal findings; nose clamp applied

## 2018-08-13 ENCOUNTER — Other Ambulatory Visit: Payer: Self-pay | Admitting: Physician Assistant

## 2018-08-13 DIAGNOSIS — Z1231 Encounter for screening mammogram for malignant neoplasm of breast: Secondary | ICD-10-CM

## 2018-08-21 ENCOUNTER — Other Ambulatory Visit: Payer: Self-pay

## 2018-08-21 ENCOUNTER — Ambulatory Visit (INDEPENDENT_AMBULATORY_CARE_PROVIDER_SITE_OTHER): Payer: 59 | Admitting: Physician Assistant

## 2018-08-21 ENCOUNTER — Encounter: Payer: Self-pay | Admitting: Physician Assistant

## 2018-08-21 VITALS — BP 123/77 | HR 80 | Temp 98.3°F | Resp 16 | Wt 183.4 lb

## 2018-08-21 DIAGNOSIS — R300 Dysuria: Secondary | ICD-10-CM | POA: Diagnosis not present

## 2018-08-21 DIAGNOSIS — G43119 Migraine with aura, intractable, without status migrainosus: Secondary | ICD-10-CM | POA: Diagnosis not present

## 2018-08-21 LAB — POCT URINALYSIS DIPSTICK
Bilirubin, UA: NEGATIVE
Blood, UA: POSITIVE
Glucose, UA: NEGATIVE
Ketones, UA: NEGATIVE
Nitrite, UA: NEGATIVE
Protein, UA: NEGATIVE
Spec Grav, UA: 1.01 (ref 1.010–1.025)
Urobilinogen, UA: 0.2 E.U./dL
pH, UA: 6 (ref 5.0–8.0)

## 2018-08-21 MED ORDER — KETOROLAC TROMETHAMINE 60 MG/2ML IM SOLN
60.0000 mg | Freq: Once | INTRAMUSCULAR | Status: AC
  Administered 2018-08-21: 60 mg via INTRAMUSCULAR

## 2018-08-21 NOTE — Patient Instructions (Signed)
B2 200 mg daily  Magnesium 400 mg daily for migraine headache

## 2018-08-21 NOTE — Progress Notes (Signed)
Patient: Megan Bass Female    DOB: Mar 25, 1965   53 y.o.   MRN: [id number] Visit Date: 08/21/2018  Today's Provider: Trinna Post, [name]   Chief Complaint  Patient presents with  . Headache  . Urinary Tract Infection   Subjective:     HPI Urinary Tract Infection: Patient complains of burning with urination, dysuria and frequency She has had symptoms for 1 day. Patient also complains of headache. Patient denies back pain. Patient does not have a history of recurrent UTI.  Patient does not have a history of pyelonephritis. Had sex three times in the last two weeks. Two of three times during sex it was very painful. Has a history of breast cancer and tamoxifen therapy. Reports vaginal atrophy. Denies dysuria but reports spasm of bladder. However, this resolved yesterday.  Patient c/o headaches on and off for several months. Had nose bleed and sinus congestion on 07/31/2018. Had a head CT scan which was normal except for prior FESS. She was prescribed amoxicillin and prednisone. Followed up with Dr. Selena Batten who said she did not have a sinus infection and told her not to take amoxicillin and instead gave her prednisone. She then had a spot cauterized. Nose is still sore after cauterization on 08/06/2018 on with Dr. Selena Batten. Took excedrin migraine this morning. She has tolerated toradol injection well in the past.    Allergies  Allergen Reactions  . Iodides Itching  . Povidone Iodine   . Sulfa Antibiotics Rash     Current Outpatient Medications:  .  Calcium Carbonate-Vit D-Min (CALCIUM 1200 PO), Take 1 tablet by mouth daily., Disp: , Rfl:  .  cetirizine (ZYRTEC) 10 MG tablet, TAKE 1 TABLET BY MOUTH EACH MORNING AS NEEDED FOR ALLERGIES, Disp: , Rfl: 3 .  cholecalciferol (VITAMIN D) 1000 UNITS tablet, Take 1,000 Units by mouth daily., Disp: , Rfl:  .  citalopram (CELEXA) 40 MG tablet, Take 1 tablet (40 mg total) by mouth daily., Disp: 90 tablet, Rfl: 1 .   ERGOTAMINE-PB-BELLADONNA PO, Take by mouth 2 (two) times daily., Disp: , Rfl:  .  triamcinolone (NASACORT ALLERGY 24HR) 55 MCG/ACT AERO nasal inhaler, Place 2 sprays into the nose daily., Disp: , Rfl:   Review of Systems  Constitutional: Negative.   HENT: Positive for nosebleeds and postnasal drip.   Cardiovascular: Negative.   Genitourinary: Positive for dysuria, frequency, hematuria and urgency.  Neurological: Positive for headaches.    Social History   Tobacco Use  . Smoking status: Never Smoker  . Smokeless tobacco: Never Used  Substance Use Topics  . Alcohol use: Yes    Alcohol/week: 6.0 standard drinks    Types: 6 Glasses of wine per week      Objective:   BP 123/77 (BP Location: Left Arm, Patient Position: Sitting, Cuff Size: Large)   Pulse 80   Temp 98.3 F (36.8 C) (Oral)   Resp 16   Wt 183 lb 6.4 oz (83.2 kg)   SpO2 97%   PF 78 L/min   BMI 32.49 kg/m  Vitals:   08/21/18 1051  BP: 123/77  Pulse: 80  Resp: 16  Temp: 98.3 F (36.8 C)  TempSrc: Oral  SpO2: 97%  Weight: 183 lb 6.4 oz (83.2 kg)  PF: 78 L/min     Physical Exam Constitutional:      Appearance: She is well-developed.  Cardiovascular:     Rate and Rhythm: Normal rate and regular rhythm.  Pulmonary:  Effort: Pulmonary effort is normal.     Breath sounds: Normal breath sounds.  Abdominal:     General: Bowel sounds are normal.     Palpations: Abdomen is soft.     Tenderness: There is no abdominal tenderness. There is no right CVA tenderness or left CVA tenderness.  Skin:    General: Skin is warm and dry.  Neurological:     Mental Status: She is alert.  Psychiatric:        Speech: Speech normal.        Behavior: Behavior normal.         Assessment & Plan    1. Intractable migraine with aura without status migrainosus  Will treat as below. Instructed about starting vitamins B2 and magnesium oxide for prophylaxis. Counseled possible diarrhea with magnesium oxide.   - ketorolac  (TORADOL) injection 60 mg  2. Dysuria  Possible small blood on urinalysis but otherwise normal. Will culture. Suspect symptoms 2/2 vaginal atrophy.  - POCT urinalysis dipstick - Urine Culture  The entirety of the information documented in the History of Present Illness, Review of Systems and Physical Exam were personally obtained by me. Portions of this information were initially documented by Lynford Humphrey, CMA and reviewed by me for thoroughness and accuracy.   F/u PRN    Trinna Post, [name]  [facility]

## 2018-08-23 LAB — URINE CULTURE

## 2018-08-24 ENCOUNTER — Telehealth: Payer: Self-pay

## 2018-08-24 MED ORDER — NITROFURANTOIN MONOHYD MACRO 100 MG PO CAPS: 100.0000 mg | ORAL_CAPSULE | Freq: Two times a day (BID) | ORAL | 0 refills | Status: DC

## 2018-08-24 NOTE — Telephone Encounter (Signed)
Patient advised. sd 

## 2018-08-24 NOTE — Telephone Encounter (Signed)
-----   Message from Trinna Post, [place] sent at 08/24/2018  1:27 PM EDT ----- Urine culture positive for E. Coli. Please send in macrobid 100 mg BID x 5 days for cystitis, thanks.

## 2018-08-29 ENCOUNTER — Other Ambulatory Visit: Payer: Self-pay | Admitting: Obstetrics and Gynecology

## 2018-09-01 ENCOUNTER — Ambulatory Visit
Admission: RE | Admit: 2018-09-01 | Discharge: 2018-09-01 | Disposition: A | Discharge status: Home / Self Care | Payer: 59 | Source: Ambulatory Visit | Attending: Physician Assistant | Admitting: Physician Assistant

## 2018-09-01 ENCOUNTER — Other Ambulatory Visit: Payer: Self-pay

## 2018-09-01 DIAGNOSIS — Z1231 Encounter for screening mammogram for malignant neoplasm of breast: Secondary | ICD-10-CM | POA: Insufficient documentation

## 2018-09-15 ENCOUNTER — Ambulatory Visit (INDEPENDENT_AMBULATORY_CARE_PROVIDER_SITE_OTHER): Payer: 59 | Admitting: Obstetrics & Gynecology

## 2018-09-15 ENCOUNTER — Encounter: Payer: Self-pay | Admitting: Obstetrics & Gynecology

## 2018-09-15 ENCOUNTER — Other Ambulatory Visit: Payer: Self-pay

## 2018-09-15 VITALS — BP 128/84 | Ht 63.0 in | Wt 184.0 lb

## 2018-09-15 DIAGNOSIS — R102 Pelvic and perineal pain: Secondary | ICD-10-CM | POA: Diagnosis not present
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
## 2018-09-17 ENCOUNTER — Encounter: Payer: Self-pay | Admitting: Physician Assistant

## 2018-09-23 ENCOUNTER — Encounter: Payer: Self-pay | Admitting: Physician Assistant
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
## 2018-09-24 ENCOUNTER — Telehealth (INDEPENDENT_AMBULATORY_CARE_PROVIDER_SITE_OTHER): Payer: 59 | Admitting: Physician Assistant

## 2018-09-24 DIAGNOSIS — R102 Pelvic and perineal pain: Secondary | ICD-10-CM

## 2018-09-24 MED ORDER — CYCLOBENZAPRINE HCL 5 MG PO TABS: 5.0000 mg | ORAL_TABLET | Freq: Three times a day (TID) | ORAL | 0 refills | Status: DC | PRN

## 2018-09-24 NOTE — Patient Instructions (Signed)
Muscle Strain A muscle strain is an injury that happens when a muscle is stretched longer than normal. This can happen during a fall, sports, or lifting. This can tear some muscle fibers. Usually, recovery from muscle strain takes 1-2 weeks. Complete healing normally takes 5-6 weeks. This condition is first treated with PRICE therapy. This involves:  Protecting your muscle from being injured again.  Resting your injured muscle.  Icing your injured muscle.  Applying pressure (compression) to your injured muscle. This may be done with a splint or elastic bandage.  Raising (elevating) your injured muscle. Your doctor may also recommend medicine for pain. Follow these instructions at home: If you have a splint:  Wear the splint as told by your doctor. Take it off only as told by your doctor.  Loosen the splint if your fingers or toes tingle, get numb, or turn cold and blue.  Keep the splint clean.  If the splint is not waterproof: ? Do not let it get wet. ? Cover it with a watertight covering when you take a bath or a shower. Managing pain, stiffness, and swelling   If directed, put ice on your injured area. ? If you have a removable splint, take it off as told by your doctor. ? Put ice in a plastic bag. ? Place a towel between your skin and the bag. ? Leave the ice on for 20 minutes, 2-3 times a day.  Move your fingers or toes often. This helps to avoid stiffness and lessen swelling.  Raise your injured area above the level of your heart while you are sitting or lying down.  Wear an elastic bandage as told by your doctor. Make sure it is not too tight. General instructions  Take over-the-counter and prescription medicines only as told by your doctor.  Limit your activity. Rest your injured muscle as told by your doctor. Your doctor may say that gentle movements are okay.  If physical therapy was prescribed, do exercises as told by your doctor.  Do not put pressure on any  part of the splint until it is fully hardened. This may take many hours.  Do not use any products that contain nicotine or tobacco, such as cigarettes and e-cigarettes. These can delay bone healing. If you need help quitting, ask your doctor.  Warm up before you exercise. This helps to prevent more muscle strains.  Ask your doctor when it is safe to drive if you have a splint.  Keep all follow-up visits as told by your doctor. This is important. Contact a doctor if:  You have more pain or swelling in your injured area. Get help right away if:  You have any of these problems in your injured area: ? You have numbness. ? You have tingling. ? You lose a lot of strength. Summary  A muscle strain is an injury that happens when a muscle is stretched longer than normal.  This condition is first treated with PRICE therapy. This includes protecting, resting, icing, adding pressure, and raising your injury.  Limit your activity. Rest your injured muscle as told by your doctor. Your doctor may say that gentle movements are okay.  Warm up before you exercise. This helps to prevent more muscle strains. This information is not intended to replace advice given to you by your health care provider. Make sure you discuss any questions you have with your health care provider. Document Released: 11/28/2007 Document Revised: 04/16/2018 Document Reviewed: 03/27/2016 Elsevier Patient Education  2020 Elsevier Inc.  

## 2018-09-25 ENCOUNTER — Other Ambulatory Visit (INDEPENDENT_AMBULATORY_CARE_PROVIDER_SITE_OTHER): Payer: 59 | Admitting: Physician Assistant

## 2018-09-25 ENCOUNTER — Encounter: Payer: Self-pay | Admitting: Physician Assistant

## 2018-09-25 DIAGNOSIS — R102 Pelvic and perineal pain: Secondary | ICD-10-CM

## 2018-09-25 DIAGNOSIS — R300 Dysuria: Secondary | ICD-10-CM | POA: Diagnosis not present

## 2018-09-25 DIAGNOSIS — N3090 Cystitis, unspecified without hematuria: Secondary | ICD-10-CM

## 2018-09-25 LAB — POCT URINALYSIS DIPSTICK
Bilirubin, UA: NEGATIVE
Blood, UA: NEGATIVE
Glucose, UA: NEGATIVE
Ketones, UA: NEGATIVE
Leukocytes, UA: NEGATIVE
Nitrite, UA: NEGATIVE
Protein, UA: NEGATIVE
Spec Grav, UA: 1.01 (ref 1.010–1.025)
Urobilinogen, UA: 0.2 E.U./dL
pH, UA: 5 (ref 5.0–8.0)

## 2018-09-25 MED ORDER — NITROFURANTOIN MONOHYD MACRO 100 MG PO CAPS: 100.0000 mg | ORAL_CAPSULE | Freq: Two times a day (BID) | ORAL | 0 refills | Status: AC

## 2018-09-25 NOTE — Progress Notes (Signed)
Patient stopping by clinic between 11:00 AM -11:30 AM to drop off urine for pelvic pain and dysuria. Orders for dipstick and urine culture placed.

## 2018-09-27 LAB — CULTURE, URINE COMPREHENSIVE

## 2018-09-27 LAB — SPECIMEN STATUS REPORT

## 2018-09-28 ENCOUNTER — Encounter: Payer: Self-pay | Admitting: Physician Assistant

## 2018-09-28 DIAGNOSIS — S338XXD Sprain of other parts of lumbar spine and pelvis, subsequent encounter: Secondary | ICD-10-CM

## 2018-09-28 MED ORDER — METHYLPREDNISOLONE 4 MG PO TBPK: ORAL_TABLET | ORAL | 0 refills | Status: DC

## 2018-09-28 NOTE — Addendum Note (Signed)
Addended by: Mar Daring on: 09/28/2018 11:26 AM   Modules accepted: Orders

## 2018-10-01 ENCOUNTER — Ambulatory Visit: Payer: 59 | Admitting: General Surgery

## 2018-10-02 ENCOUNTER — Encounter: Payer: Self-pay | Admitting: General Surgery

## 2018-10-23 ENCOUNTER — Other Ambulatory Visit: Payer: Self-pay | Admitting: Physician Assistant

## 2018-10-23 DIAGNOSIS — F4329 Adjustment disorder with other symptoms: Secondary | ICD-10-CM

## 2018-12-24 ENCOUNTER — Encounter: Payer: Self-pay | Admitting: Physician Assistant

## 2019-04-13 ENCOUNTER — Encounter: Payer: Self-pay | Admitting: Physician Assistant

## 2019-04-13 ENCOUNTER — Telehealth: Payer: Self-pay

## 2019-04-15 ENCOUNTER — Telehealth: Payer: Self-pay | Admitting: Physician Assistant

## 2019-04-15 NOTE — Telephone Encounter (Signed)
Can we call in patient's ergotamine belladonna medication to pharmacy? I think it has to be called in thank you.

## 2019-04-21 ENCOUNTER — Encounter: Payer: Self-pay | Admitting: Physician Assistant

## 2019-04-21 NOTE — Telephone Encounter (Signed)
Spoke with Misty at [facility] and gave her the verbal order to refill Barnetta Chapel Ergotamine-PB Belladonna medication for 90 days.

## 2019-06-01 ENCOUNTER — Other Ambulatory Visit: Payer: Self-pay

## 2019-06-01 ENCOUNTER — Ambulatory Visit (INDEPENDENT_AMBULATORY_CARE_PROVIDER_SITE_OTHER): Payer: 59 | Admitting: Physician Assistant

## 2019-06-01 ENCOUNTER — Encounter: Payer: Self-pay | Admitting: Physician Assistant

## 2019-06-01 VITALS — BP 126/76 | HR 67 | Temp 96.6°F | Wt 183.8 lb

## 2019-06-01 DIAGNOSIS — R232 Flushing: Secondary | ICD-10-CM

## 2019-06-01 DIAGNOSIS — N6091 Unspecified benign mammary dysplasia of right breast: Secondary | ICD-10-CM

## 2019-06-01 MED ORDER — CLONIDINE HCL 0.1 MG PO TABS: 0.1000 mg | ORAL_TABLET | Freq: Every day | ORAL | 0 refills | Status: DC

## 2019-06-01 NOTE — Patient Instructions (Signed)
Menopause Menopause is the normal time of life when menstrual periods stop completely. It is usually confirmed by 12 months without a menstrual period. The transition to menopause (perimenopause) most often happens between the ages of 45 and 55. During perimenopause, hormone levels change in your body, which can cause symptoms and affect your health. Menopause may increase your risk for:  Loss of bone (osteoporosis), which causes bone breaks (fractures).  Depression.  Hardening and narrowing of the arteries (atherosclerosis), which can cause heart attacks and strokes. What are the causes? This condition is usually caused by a natural change in hormone levels that happens as you get older. The condition may also be caused by surgery to remove both ovaries (bilateral oophorectomy). What increases the risk? This condition is more likely to start at an earlier age if you have certain medical conditions or treatments, including:  A tumor of the pituitary gland in the brain.  A disease that affects the ovaries and hormone production.  Radiation treatment for cancer.  Certain cancer treatments, such as chemotherapy or hormone (anti-estrogen) therapy.  Heavy smoking and excessive alcohol use.  Family history of early menopause. This condition is also more likely to develop earlier in women who are very thin. What are the signs or symptoms? Symptoms of this condition include:  Hot flashes.  Irregular menstrual periods.  Night sweats.  Changes in feelings about sex. This could be a decrease in sex drive or an increased comfort around your sexuality.  Vaginal dryness and thinning of the vaginal walls. This may cause painful intercourse.  Dryness of the skin and development of wrinkles.  Headaches.  Problems sleeping (insomnia).  Mood swings or irritability.  Memory problems.  Weight gain.  Hair growth on the face and chest.  Bladder infections or problems with urinating. How  is this diagnosed? This condition is diagnosed based on your medical history, a physical exam, your age, your menstrual history, and your symptoms. Hormone tests may also be done. How is this treated? In some cases, no treatment is needed. You and your health care provider should make a decision together about whether treatment is necessary. Treatment will be based on your individual condition and preferences. Treatment for this condition focuses on managing symptoms. Treatment may include:  Menopausal hormone therapy (MHT).  Medicines to treat specific symptoms or complications.  Acupuncture.  Vitamin or herbal supplements. Before starting treatment, make sure to let your health care provider know if you have a personal or family history of:  Heart disease.  Breast cancer.  Blood clots.  Diabetes.  Osteoporosis. Follow these instructions at home: Lifestyle  Do not use any products that contain nicotine or tobacco, such as cigarettes and e-cigarettes. If you need help quitting, ask your health care provider.  Get at least 30 minutes of physical activity on 5 or more days each week.  Avoid alcoholic and caffeinated beverages, as well as spicy foods. This may help prevent hot flashes.  Get 7-8 hours of sleep each night.  If you have hot flashes, try: ? Dressing in layers. ? Avoiding things that may trigger hot flashes, such as spicy food, warm places, or stress. ? Taking slow, deep breaths when a hot flash starts. ? Keeping a fan in your home and office.  Find ways to manage stress, such as deep breathing, meditation, or journaling.  Consider going to group therapy with other women who are having menopause symptoms. Ask your health care provider about recommended group therapy meetings. Eating and   drinking  Eat a healthy, balanced diet that contains whole grains, lean protein, low-fat dairy, and plenty of fruits and vegetables.  Your health care provider may recommend  adding more soy to your diet. Foods that contain soy include tofu, tempeh, and soy milk.  Eat plenty of foods that contain calcium and vitamin D for bone health. Items that are rich in calcium include low-fat milk, yogurt, beans, almonds, sardines, broccoli, and kale. Medicines  Take over-the-counter and prescription medicines only as told by your health care provider.  Talk with your health care provider before starting any herbal supplements. If prescribed, take vitamins and supplements as told by your health care provider. These may include: ? Calcium. Women age 51 and older should get 1,200 mg (milligrams) of calcium every day. ? Vitamin D. Women need 600-800 International Units of vitamin D each day. ? Vitamins B12 and B6. Aim for 50 micrograms of B12 and 1.5 mg of B6 each day. General instructions  Keep track of your menstrual periods, including: ? When they occur. ? How heavy they are and how long they last. ? How much time passes between periods.  Keep track of your symptoms, noting when they start, how often you have them, and how long they last.  Use vaginal lubricants or moisturizers to help with vaginal dryness and improve comfort during sex.  Keep all follow-up visits as told by your health care provider. This is important. This includes any group therapy or counseling. Contact a health care provider if:  You are still having menstrual periods after age 55.  You have pain during sex.  You have not had a period for 12 months and you develop vaginal bleeding. Get help right away if:  You have: ? Severe depression. ? Excessive vaginal bleeding. ? Pain when you urinate. ? A fast or irregular heart beat (palpitations). ? Severe headaches. ? Abdomen (abdominal) pain or severe indigestion.  You fell and you think you have a broken bone.  You develop leg or chest pain.  You develop vision problems.  You feel a lump in your breast. Summary  Menopause is the normal  time of life when menstrual periods stop completely. It is usually confirmed by 12 months without a menstrual period.  The transition to menopause (perimenopause) most often happens between the ages of 45 and 55.  Symptoms can be managed through medicines, lifestyle changes, and complementary therapies such as acupuncture.  Eat a balanced diet that is rich in nutrients to promote bone health and heart health and to manage symptoms during menopause. This information is not intended to replace advice given to you by your health care provider. Make sure you discuss any questions you have with your health care provider. Document Revised: 01/31/2017 Document Reviewed: 03/23/2016 Elsevier Patient Education  2020 Elsevier Inc.  

## 2019-06-01 NOTE — Progress Notes (Signed)
Patient: Megan Bass Female    DOB: 01-11-1966   54 y.o.   MRN: [id number] Visit Date: 06/01/2019  Today's Provider: Trinna Post, [name]   Chief Complaint  Patient presents with  . Hot Flashes   Subjective:     HPI  Hot Flashes Patient with a history of breast cancer and hysterectomy presents today for hot flashes. She reports that the hot flashes are worse for the last 2 weeks. Patient reports that she is woke up out her sleep from hot flashes. She reports taking Ergotamine-PB-Belladonna and it currently not helping with her hot flashes. Previously when she had hot flashes she would have occasional night sweats but these are now constant. No fevers or weight loss. First had hot flashes starting with Tamoxifen 6 years ago, she took this medication for four years and has been taking a break. She had a hysterectomy in 2012 but her ovaries remained     Wt Readings from Last 3 Encounters:  06/01/19 183 lb 12.8 oz (83.4 kg)  09/15/18 184 lb (83.5 kg)  08/21/18 183 lb 6.4 oz (83.2 kg)      Allergies  Allergen Reactions  . Iodides Itching  . Povidone Iodine   . Sulfa Antibiotics Rash     Current Outpatient Medications:  .  Calcium Carbonate-Vit D-Min (CALCIUM 1200 PO), Take 1 tablet by mouth daily., Disp: , Rfl:  .  cetirizine (ZYRTEC) 10 MG tablet, TAKE 1 TABLET BY MOUTH EACH MORNING AS NEEDED FOR ALLERGIES, Disp: , Rfl: 3 .  cholecalciferol (VITAMIN D) 1000 UNITS tablet, Take 1,000 Units by mouth daily., Disp: , Rfl:  .  citalopram (CELEXA) 40 MG tablet, TAKE 1 TABLET BY MOUTH  DAILY, Disp: 90 tablet, Rfl: 3 .  ERGOTAMINE-PB-BELLADONNA PO, Take by mouth 2 (two) times daily., Disp: , Rfl:  .  triamcinolone (NASACORT ALLERGY 24HR) 55 MCG/ACT AERO nasal inhaler, Place 2 sprays into the nose daily., Disp: , Rfl:  .  cyclobenzaprine (FLEXERIL) 5 MG tablet, Take 1 tablet (5 mg total) by mouth 3 (three) times daily as needed for muscle spasms. (Patient not taking:  Reported on 06/01/2019), Disp: 30 tablet, Rfl: 0 .  INTRAROSA 6.5 MG INST, PLACE 1 CAPSULE VAGINALLY AT BEDTIME. (Patient not taking: Reported on 06/01/2019), Disp: 28 each, Rfl: 11 .  MAGNESIUM PO, Take 1 tablet by mouth daily., Disp: , Rfl:  .  methylPREDNISolone (MEDROL) 4 MG TBPK tablet, 6 day taper; take as directed on package instructions (Patient not taking: Reported on 06/01/2019), Disp: 21 tablet, Rfl: 0 .  Riboflavin (B2 PO), Take 1 tablet by mouth daily., Disp: , Rfl:   Review of Systems  Constitutional: Negative.   Respiratory: Negative.   Genitourinary: Negative.   Hematological: Negative.   Psychiatric/Behavioral: Positive for sleep disturbance.    Social History   Tobacco Use  . Smoking status: Never Smoker  . Smokeless tobacco: Never Used  Substance Use Topics  . Alcohol use: Yes    Alcohol/week: 6.0 standard drinks    Types: 6 Glasses of wine per week      Objective:   BP 126/76 (BP Location: Left Arm, Patient Position: Sitting, Cuff Size: Normal)   Pulse 67   Temp (!) 96.6 F (35.9 C) (Temporal)   Wt 183 lb 12.8 oz (83.4 kg)   SpO2 97%   BMI 32.56 kg/m  Vitals:   06/01/19 1527  BP: 126/76  Pulse: 67  Temp: (!) 96.6 F (35.9 C)  TempSrc: Temporal  SpO2: 97%  Weight: 183 lb 12.8 oz (83.4 kg)  Body mass index is 32.56 kg/m.   Physical Exam Constitutional:      Appearance: Normal appearance.  Cardiovascular:     Rate and Rhythm: Normal rate and regular rhythm.     Pulses: Normal pulses.     Heart sounds: Normal heart sounds.  Pulmonary:     Effort: Pulmonary effort is normal.     Breath sounds: Normal breath sounds.  Skin:    General: Skin is warm and dry.  Neurological:     Mental Status: She is alert. Mental status is at baseline.  Psychiatric:        Mood and Affect: Mood normal.        Behavior: Behavior normal.      No results found for any visits on 06/01/19.     Assessment & Plan    1. Hot flashes  Suspect she is likely  going through menopause currently as she is at the average age and still has ovaries. Discussed non-hormonal hot flash treatments due to history of breast cancer. As she is currently stable on Celexa in regards to mood, we will not change to Paxil. Trial of clonidine. May consider paxil in the future.   - Comprehensive Metabolic Panel (CMET) - CBC with Differential - TSH - cloNIDine (CATAPRES) 0.1 MG tablet; Take 1 tablet (0.1 mg total) by mouth daily.  Dispense: 30 tablet; Refill: 0 - cloNIDine (CATAPRES) 0.1 MG tablet; Take 1 tablet (0.1 mg total) by mouth daily.  Dispense: 90 tablet; Refill: 0  The entirety of the information documented in the History of Present Illness, Review of Systems and Physical Exam were personally obtained by me. Portions of this information were initially documented by [facility] and reviewed by me for thoroughness and accuracy.      Trinna Post, [name]  [facility]

## 2019-06-02 ENCOUNTER — Telehealth: Payer: Self-pay

## 2019-06-02 LAB — CBC WITH DIFFERENTIAL/PLATELET
Basophils Absolute: 0 10*3/uL (ref 0.0–0.2)
Basos: 1 %
EOS (ABSOLUTE): 0.1 10*3/uL (ref 0.0–0.4)
Eos: 1 %
Hematocrit: 42.2 % (ref 34.0–46.6)
Hemoglobin: 14.3 g/dL (ref 11.1–15.9)
Immature Grans (Abs): 0 10*3/uL (ref 0.0–0.1)
Immature Granulocytes: 0 %
Lymphocytes Absolute: 1.6 10*3/uL (ref 0.7–3.1)
Lymphs: 27 %
MCH: 32.4 pg (ref 26.6–33.0)
MCHC: 33.9 g/dL (ref 31.5–35.7)
MCV: 96 fL (ref 79–97)
Monocytes Absolute: 0.6 10*3/uL (ref 0.1–0.9)
Monocytes: 10 %
Neutrophils Absolute: 3.6 10*3/uL (ref 1.4–7.0)
Neutrophils: 61 %
Platelets: 287 10*3/uL (ref 150–450)
RBC: 4.41 x10E6/uL (ref 3.77–5.28)
RDW: 12.5 % (ref 11.7–15.4)
WBC: 5.9 10*3/uL (ref 3.4–10.8)

## 2019-06-02 LAB — COMPREHENSIVE METABOLIC PANEL
ALT: 16 IU/L (ref 0–32)
AST: 19 IU/L (ref 0–40)
Albumin/Globulin Ratio: 1.5 (ref 1.2–2.2)
Albumin: 4.5 g/dL (ref 3.8–4.9)
Alkaline Phosphatase: 73 IU/L (ref 39–117)
BUN/Creatinine Ratio: 21 (ref 9–23)
BUN: 19 mg/dL (ref 6–24)
Bilirubin Total: <0.2 mg/dL (ref 0.0–1.2)
CO2: 22 mmol/L (ref 20–29)
Calcium: 9.8 mg/dL (ref 8.7–10.2)
Chloride: 98 mmol/L (ref 96–106)
Creatinine, Ser: 0.91 mg/dL (ref 0.57–1.00)
GFR calc Af Amer: 83 mL/min/{1.73_m2} (ref >59)
GFR calc non Af Amer: 72 mL/min/{1.73_m2} (ref >59)
Globulin, Total: 3.1 g/dL (ref 1.5–4.5)
Glucose: 88 mg/dL (ref 65–99)
Potassium: 4.1 mmol/L (ref 3.5–5.2)
Sodium: 138 mmol/L (ref 134–144)
Total Protein: 7.6 g/dL (ref 6.0–8.5)

## 2019-06-02 LAB — TSH: TSH: 3.26 u[IU]/mL (ref 0.450–4.500)

## 2019-06-02 NOTE — Telephone Encounter (Signed)
Copied from [place] [phone]. Topic: General - Other >> Jun 02, 2019  3:04 PM Antonieta Iba C wrote: Reason for CRM: pt is returning [place] call to provide the name of her pharmacy.  [facility] [phone]

## 2019-06-02 NOTE — Telephone Encounter (Signed)
Misty w/ [facility] [phone] was advised of refill for Belladonna for the patient. Patient was advised that medication was called into pharmacy.

## 2019-06-02 NOTE — Addendum Note (Signed)
Addended by: Trinna Post on: 06/02/2019 02:03 PM   Modules accepted: Orders

## 2019-08-05 ENCOUNTER — Other Ambulatory Visit: Payer: Self-pay | Admitting: Physician Assistant

## 2019-08-05 DIAGNOSIS — F4329 Adjustment disorder with other symptoms: Secondary | ICD-10-CM

## 2019-08-05 DIAGNOSIS — R232 Flushing: Secondary | ICD-10-CM

## 2019-09-07 ENCOUNTER — Encounter: Payer: Self-pay | Admitting: Physician Assistant

## 2019-09-13 NOTE — Telephone Encounter (Signed)
Can we call this in, one pill two times a day [facility]?

## 2019-10-01 ENCOUNTER — Ambulatory Visit (INDEPENDENT_AMBULATORY_CARE_PROVIDER_SITE_OTHER): Payer: 59 | Admitting: Physician Assistant

## 2019-10-01 ENCOUNTER — Ambulatory Visit: Payer: Self-pay

## 2019-10-01 DIAGNOSIS — J040 Acute laryngitis: Secondary | ICD-10-CM | POA: Diagnosis not present

## 2019-10-01 NOTE — Progress Notes (Addendum)
MyChart Video Visit    Virtual Visit via Video Note   This visit type was conducted due to national recommendations for restrictions regarding the COVID-19 Pandemic (e.g. social distancing) in an effort to limit this patient's exposure and mitigate transmission in our community. This patient is at least at moderate risk for complications without adequate follow up. This format is felt to be most appropriate for this patient at this time. Physical exam was limited by quality of the video and audio technology used for the visit.   Patient location: Home Provider location: Office    I discussed the limitations of evaluation and management by telemedicine and the availability of in person appointments. The patient expressed understanding and agreed to proceed.   Patient: Megan Bass   DOB: [birth year]   54 y.o. Female  MRN: [id number] Visit Date: 10/01/2019  Today's healthcare provider: Trinna Post, [name]   Chief Complaint  Patient presents with  . Sore Throat  I,[name],acting as a scribe for Trinna Post, [name].,have documented all relevant documentation on the behalf of Trinna Post, [name],as directed by  Trinna Post, [name] while in the presence of Trinna Post, [name].  Subjective    Sore Throat  This is a new problem. The current episode started in the past 7 days. The problem has been unchanged. There has been no fever. The pain is at a severity of 5/10. The pain is moderate. Associated symptoms include coughing, headaches and a hoarse voice. Pertinent negatives include no congestion, diarrhea, ear discharge, ear pain, neck pain, shortness of breath, swollen glands or trouble swallowing. Treatments tried: mucinex  The treatment provided mild relief.    Reports she felt unwell beginning Monday. She reports loss of voice. In the interim, she had dizziness and migraines. Reports dizziness is provoked by head motions. Reports continuous sinus drainage.  She also tried Tylenol.     Medications: Outpatient Medications Prior to Visit  Medication Sig  . Calcium Carbonate-Vit D-Min (CALCIUM 1200 PO) Take 1 tablet by mouth daily.  . cetirizine (ZYRTEC) 10 MG tablet TAKE 1 TABLET BY MOUTH EACH MORNING AS NEEDED FOR ALLERGIES  . cholecalciferol (VITAMIN D) 1000 UNITS tablet Take 1,000 Units by mouth daily.  . citalopram (CELEXA) 40 MG tablet TAKE 1 TABLET BY MOUTH  DAILY  . cloNIDine (CATAPRES) 0.1 MG tablet Take 1 tablet (0.1 mg total) by mouth daily.  . cloNIDine (CATAPRES) 0.1 MG tablet TAKE 1 TABLET BY MOUTH  DAILY  . ERGOTAMINE-PB-BELLADONNA PO Take by mouth 2 (two) times daily.  Marland Kitchen MAGNESIUM PO Take 1 tablet by mouth daily.  . Riboflavin (B2 PO) Take 1 tablet by mouth daily.  Marland Kitchen triamcinolone (NASACORT ALLERGY 24HR) 55 MCG/ACT AERO nasal inhaler Place 2 sprays into the nose daily.  [name] 6.5 MG INST PLACE 1 CAPSULE VAGINALLY AT BEDTIME. (Patient not taking: Reported on 06/01/2019)   No facility-administered medications prior to visit.    Review of Systems  Constitutional: Positive for fatigue.  HENT: Positive for hoarse voice, postnasal drip, sore throat and voice change. Negative for congestion, ear discharge, ear pain and trouble swallowing.   Respiratory: Positive for cough. Negative for shortness of breath.   Gastrointestinal: Negative for diarrhea.  Musculoskeletal: Negative for neck pain.  Neurological: Positive for dizziness and headaches.      Objective    There were no vitals taken for this visit.   Physical Exam Constitutional:      Appearance: Normal  appearance.  Pulmonary:     Effort: Pulmonary effort is normal. No respiratory distress.  Neurological:     Mental Status: She is alert.  Psychiatric:        Mood and Affect: Mood normal.        Behavior: Behavior normal.        Assessment & Plan    1. Laryngitis  - symptoms and exam c/w viral URI  - no evidence of strep pharyngitis, CAP, AOM,  bacterial sinusitis, or other bacterial infection - concern for possible COVID19 infection - will send for outpatient testing - discussed need to quarantine 10 days from start of symptoms and until fever-free for at least 48 hours - discussed need to quarantine household members - discussed symptomatic management, natural course, and return precautions -discussed sx treatment including prednisone, gargles, etc.   I have spent 15 minutes with this patient, >50% of which was spent on counseling and coordination of care.    Return if symptoms worsen or fail to improve.     I discussed the assessment and treatment plan with the patient. The patient was provided an opportunity to ask questions and all were answered. The patient agreed with the plan and demonstrated an understanding of the instructions.   The patient was advised to call back or seek an in-person evaluation if the symptoms worsen or if the condition fails to improve as anticipated.  ITrinna Post, [name], have reviewed all documentation for this visit. The documentation on 10/06/19 for the exam, diagnosis, procedures, and orders are all accurate and complete.   Paulene Floor [facility] [phone] (phone) [phone] (fax)  [place]

## 2019-10-01 NOTE — Telephone Encounter (Signed)
Pt. Reports this week she has had sinus congestion, productive cough and dizziness. Has tried OTC Mucinex with no relief. Has yellow mucus with cough. No fever. Has both of her COVID 19 vaccines. Virtual visit scheduled for today.  Reason for Disposition . [1] MODERATE dizziness (e.g., interferes with normal activities) AND [2] has NOT been evaluated by physician for this  (Exception: dizziness caused by heat exposure, sudden standing, or poor fluid intake)  Answer Assessment - Initial Assessment Questions 1. DESCRIPTION: "Describe your dizziness."     Lightheaded 2. LIGHTHEADED: "Do you feel lightheaded?" (e.g., somewhat faint, woozy, weak upon standing)     Woozy 3. VERTIGO: "Do you feel like either you or the room is spinning or tilting?" (i.e. vertigo)     Yes 4. SEVERITY: "How bad is it?"  "Do you feel like you are going to faint?" "Can you stand and walk?"   - MILD: Feels slightly dizzy, but walking normally.   - MODERATE: Feels very unsteady when walking, but not falling; interferes with normal activities (e.g., school, work) .   - SEVERE: Unable to walk without falling, or requires assistance to walk without falling; feels like passing out now.      Mild 5. ONSET:  "When did the dizziness begin?"     Monday 6. AGGRAVATING FACTORS: "Does anything make it worse?" (e.g., standing, change in head position)     Changing positions 7. HEART RATE: "Can you tell me your heart rate?" "How many beats in 15 seconds?"  (Note: not all patients can do this)       No 8. CAUSE: "What do you think is causing the dizziness?"     Sinus symptoms 9. RECURRENT SYMPTOM: "Have you had dizziness before?" If Yes, ask: "When was the last time?" "What happened that time?"     Yes 10. OTHER SYMPTOMS: "Do you have any other symptoms?" (e.g., fever, chest pain, vomiting, diarrhea, bleeding)       Sinus and cough 11. PREGNANCY: "Is there any chance you are pregnant?" "When was your last menstrual period?"        No  Protocols used: DIZZINESS [facility]

## 2019-10-04 ENCOUNTER — Encounter: Payer: Self-pay | Admitting: Physician Assistant

## 2019-10-25 ENCOUNTER — Other Ambulatory Visit: Payer: Self-pay | Admitting: Physician Assistant

## 2019-10-25 DIAGNOSIS — Z1231 Encounter for screening mammogram for malignant neoplasm of breast: Secondary | ICD-10-CM

## 2019-11-18 ENCOUNTER — Ambulatory Visit
Admission: RE | Admit: 2019-11-18 | Discharge: 2019-11-18 | Disposition: A | Discharge status: Home / Self Care | Payer: 59 | Source: Ambulatory Visit | Attending: Physician Assistant | Admitting: Physician Assistant

## 2019-11-18 ENCOUNTER — Other Ambulatory Visit: Payer: Self-pay

## 2019-11-18 DIAGNOSIS — Z1231 Encounter for screening mammogram for malignant neoplasm of breast: Secondary | ICD-10-CM

## 2020-01-05 ENCOUNTER — Encounter: Payer: Self-pay | Admitting: Physician Assistant

## 2020-01-05 ENCOUNTER — Telehealth (INDEPENDENT_AMBULATORY_CARE_PROVIDER_SITE_OTHER): Payer: 59 | Admitting: Physician Assistant

## 2020-01-05 DIAGNOSIS — F4329 Adjustment disorder with other symptoms: Secondary | ICD-10-CM | POA: Diagnosis not present

## 2020-01-05 DIAGNOSIS — R42 Dizziness and giddiness: Secondary | ICD-10-CM | POA: Diagnosis not present

## 2020-01-05 NOTE — Progress Notes (Signed)
MyChart Video Visit    Virtual Visit via Video Note   This visit type was conducted due to national recommendations for restrictions regarding the COVID-19 Pandemic (e.g. social distancing) in an effort to limit this patient's exposure and mitigate transmission in our community. This patient is at least at moderate risk for complications without adequate follow up. This format is felt to be most appropriate for this patient at this time. Physical exam was limited by quality of the video and audio technology used for the visit.   Patient location: Home Provider location: Office   I discussed the limitations of evaluation and management by telemedicine and the availability of in person appointments. The patient expressed understanding and agreed to proceed.  Patient: Megan Bass   DOB: 1965-04-08   54 y.o. Female  MRN: [id number] Visit Date: 01/05/2020  Today's healthcare provider: Trinna Post, [name]   Chief Complaint  Patient presents with  . Sinusitis  . Dizziness   Subjective    HPI   Patient presents with dizziness ongoing since Saturday. Dizziness got worse when bending over, moving her head when on the computer. She did the Epley maneuvers which at first worsened symptoms but then symptoms started to improve. She did have some subtle dizziness yesterday. She reports no nausea or vomiting. No hearing loss. No ringing in her ears. No fevers, chills, URI.   Patient reports significant stressors at work with long hours, new program disorders. She reports her daughter has noticed she is becoming more irritable and stressed. She is currently taking celexa 40 mg. She has noticed some more symptoms with her IBS as well. She has a hard time relaxing when she get home. She did get the contact for the employee assistance program.     Medications: Outpatient Medications Prior to Visit  Medication Sig  . Calcium Carbonate-Vit D-Min (CALCIUM 1200 PO) Take 1 tablet by mouth  daily.  . cetirizine (ZYRTEC) 10 MG tablet TAKE 1 TABLET BY MOUTH EACH MORNING AS NEEDED FOR ALLERGIES  . cholecalciferol (VITAMIN D) 1000 UNITS tablet Take 1,000 Units by mouth daily.  . citalopram (CELEXA) 40 MG tablet TAKE 1 TABLET BY MOUTH  DAILY  . cloNIDine (CATAPRES) 0.1 MG tablet Take 1 tablet (0.1 mg total) by mouth daily.  . cloNIDine (CATAPRES) 0.1 MG tablet TAKE 1 TABLET BY MOUTH  DAILY  . ERGOTAMINE-PB-BELLADONNA PO Take by mouth 2 (two) times daily.  [name] 6.5 MG INST PLACE 1 CAPSULE VAGINALLY AT BEDTIME. (Patient not taking: Reported on 06/01/2019)  . MAGNESIUM PO Take 1 tablet by mouth daily.  . Riboflavin (B2 PO) Take 1 tablet by mouth daily.  Marland Kitchen triamcinolone (NASACORT ALLERGY 24HR) 55 MCG/ACT AERO nasal inhaler Place 2 sprays into the nose daily.   No facility-administered medications prior to visit.    Review of Systems    Objective    There were no vitals taken for this visit.   Physical Exam Constitutional:      Appearance: Normal appearance.  Pulmonary:     Effort: Pulmonary effort is normal. No respiratory distress.  Neurological:     Mental Status: She is alert.  Psychiatric:        Mood and Affect: Mood normal.        Behavior: Behavior normal.        Assessment & Plan     1. Vertigo  Advised epley manuevers and meclizine.   2. Adjustment disorder with other symptom  Patient has reached  out to [facility] and I think this is a great first step. If she would like [place] to refer her to counseling, would be happy to.    No follow-ups on file.     I discussed the assessment and treatment plan with the patient. The patient was provided an opportunity to ask questions and all were answered. The patient agreed with the plan and demonstrated an understanding of the instructions.   The patient was advised to call back or seek an in-person evaluation if the symptoms worsen or if the condition fails to improve as anticipated.   ITrinna Post,  [name], have reviewed all documentation for this visit. The documentation on 01/12/20 for the exam, diagnosis, procedures, and orders are all accurate and complete.  The entirety of the information documented in the History of Present Illness, Review of Systems and Physical Exam were personally obtained by me. Portions of this information were initially documented by [facility] and reviewed by me for thoroughness and accuracy.    Paulene Floor [facility] [phone] (phone) [phone] (fax)  [place]

## 2020-01-31 ENCOUNTER — Encounter: Payer: Self-pay | Admitting: Physician Assistant

## 2020-02-04 NOTE — Telephone Encounter (Signed)
Can we call [facility] to refill the belladonna - ergotamine? Thank you.

## 2020-02-07 ENCOUNTER — Telehealth: Payer: Self-pay

## 2020-02-07 NOTE — Telephone Encounter (Signed)
Spoke with Legrand Como w/ [facility] to do the verbal refill order for pt Ergotamine-PB-Belladonna.

## 2020-05-10 ENCOUNTER — Encounter: Payer: Self-pay | Admitting: Physician Assistant

## 2020-05-12 NOTE — Telephone Encounter (Signed)
Can we call in refill of ergotamine belladonna to [facility]? Thank you.

## 2020-06-27 ENCOUNTER — Telehealth: Payer: Self-pay

## 2020-06-27 DIAGNOSIS — R232 Flushing: Secondary | ICD-10-CM

## 2020-06-27 MED ORDER — CLONIDINE HCL 0.1 MG PO TABS: 0.1000 mg | ORAL_TABLET | Freq: Every day | ORAL | 1 refills | Status: DC

## 2020-06-27 NOTE — Telephone Encounter (Signed)
[facility] faxed refill request for the following medications: ° °cloNIDine (CATAPRES) 0.1 MG tablet  ° °Please advise. ° °

## 2020-06-27 NOTE — Telephone Encounter (Signed)
Pt calling and is refusing to set up an appt. She states that this medication is not for her hypertension, but for her night sweats. She is requesting to have a nurse give her a call back to follow up. Please advise.

## 2020-06-27 NOTE — Telephone Encounter (Signed)
Left message for patient to call back, office visit needs to be scheduled before medication is refilled. Last office visit was 01/05/2020 BUT last office visit to address hypertension was 04/28/2018. KW

## 2020-07-18 NOTE — Telephone Encounter (Signed)
Open in error

## 2020-08-04 ENCOUNTER — Ambulatory Visit: Payer: Self-pay | Admitting: *Deleted

## 2020-08-04 ENCOUNTER — Telehealth: Payer: Self-pay

## 2020-08-04 NOTE — Telephone Encounter (Signed)
Tried calling patient. Left message to call back. Please route call back to office so that our front desk staff can schedule a work in appointment with Dr. Caryn Section.

## 2020-08-04 NOTE — Telephone Encounter (Signed)
Can open up one of the acute visit slots for her.

## 2020-08-04 NOTE — Telephone Encounter (Signed)
Routing back to office per request to set up appt.

## 2020-08-04 NOTE — Telephone Encounter (Signed)
Summary: BP issue   Pt stated she has been having issues with her BP/ todays reading was 156/99/ pt stated she doesn't have any symptoms but feels there is an issue/please advise      Patient is calling to report she has been getting elevated BP readings. Patient reports her BP is high today. Patient has had varying elevations over the past few office visits. Call sent to office- patient needs BP evaluation and possible medication start- no appointment avalible within disposition. Sent to office to see if patient can be scheduled- please call her back. Reason for Disposition . Systolic BP  >= 376 OR Diastolic >= 283  Answer Assessment - Initial Assessment Questions 1. BLOOD PRESSURE: "What is the blood pressure?" "Did you take at least two measurements 5 minutes apart?"     156/99, 156/100 P 59 2. ONSET: "When did you take your blood pressure?"     9:45, 10:00 3. HOW: "How did you obtain the blood pressure?" (e.g., visiting nurse, automatic home BP monitor)     Automatic BP- arm 4. HISTORY: "Do you have a history of high blood pressure?"     sporadic high BP 5. MEDICATIONS: "Are you taking any medications for blood pressure?" "Have you missed any doses recently?"     No medications at this time 6. OTHER SYMPTOMS: "Do you have any symptoms?" (e.g., headache, chest pain, blurred vision, difficulty breathing, weakness)     Periodically- headache- no other symptoms 7. PREGNANCY: "Is there any chance you are pregnant?" "When was your last menstrual period?"     n/a  Protocols used: BLOOD PRESSURE - HIGH-A-AH

## 2020-08-04 NOTE — Telephone Encounter (Signed)
Copied from [place] [phone]. Topic: Appointment Scheduling - Scheduling Inquiry for Clinic >> Aug 04, 2020  9:44 AM Alanda Slim E wrote: Reason for CRM: Pt called stating she has had issues with her BP and todays reading was 156/99/ Pt stated she had no other symptoms but would like to see a provider about the issues with her BP/ please advise

## 2020-08-04 NOTE — Telephone Encounter (Signed)
Blood pressure follow up scheduled for 08/14/2020 at 11:20am with Dr. Rosanna Randy.

## 2020-08-04 NOTE — Telephone Encounter (Signed)
Appointment scheduled in another encounter

## 2020-08-04 NOTE — Telephone Encounter (Signed)
Would Dr. Caryn Section be able to work this patient in next week? I see he has blocks for acute visits and there is no opening with any other provider next week. If so I will have patient go to urgent care for evaluation. KW

## 2020-08-10 ENCOUNTER — Telehealth: Payer: Self-pay

## 2020-08-10 NOTE — Telephone Encounter (Signed)
Patient was seen 01/05/2020, medication is no longer listed under active meds, patient is scheduled to see you on 08/14/20 for follow up HTN. Please advise if okay to approve for 90 day since this is no longer active. KW

## 2020-08-10 NOTE — Telephone Encounter (Signed)
[facility] is requesting refills on Celexa 40 mg. # 90

## 2020-08-11 NOTE — Progress Notes (Signed)
I,April Miller,acting as a scribe for Wilhemena Durie, MD.,have documented all relevant documentation on the behalf of Wilhemena Durie, MD,as directed by  Wilhemena Durie, MD while in the presence of Wilhemena Durie, MD.   Established patient visit   Patient: Megan Bass   DOB: February 25, 1966   55 y.o. Female  MRN: [id number] Visit Date: 08/14/2020  Today's healthcare provider: Wilhemena Durie, MD   Chief Complaint  Patient presents with   Hypertension   Subjective    HPI  Patient comes in today for follow-up.  She feels fairly well.  Her weight is up 10 pounds and her blood pressure is up. Hypertension, follow-up  BP Readings from Last 3 Encounters:  08/14/20 (!) 153/100  06/01/19 126/76  09/15/18 128/84   Wt Readings from Last 3 Encounters:  08/14/20 193 lb (87.5 kg)  06/01/19 183 lb 12.8 oz (83.4 kg)  09/15/18 184 lb (83.5 kg)     She was last seen for hypertension 1 years ago by Carles Collet, PA.  BP at that visit was 126/76. Management since that visit includes no medication changes.  She reports good compliance with treatment. She is not having side effects. none She is following a Regular diet. She is not exercising. She does not smoke.  Use of agents associated with hypertension: none.   Outside blood pressures are 156/99, 190/106, 124/87.  Pertinent labs: No results found for: CHOL, HDL, LDLCALC, LDLDIRECT, TRIG, CHOLHDL Lab Results  Component Value Date   NA 138 06/01/2019   K 4.1 06/01/2019   CREATININE 0.91 06/01/2019   GFRNONAA 72 06/01/2019   GFRAA 83 06/01/2019   GLUCOSE 88 06/01/2019     The ASCVD Risk score Mikey Bussing DC Jr., et al., 2013) failed to calculate for the following reasons:   Cannot find a previous HDL lab   Cannot find a previous total cholesterol lab   ----------------------------------------------------------------------       Medications: Outpatient Medications Prior to Visit  Medication Sig    Calcium Carbonate-Vit D-Min (CALCIUM 1200 PO) Take 1 tablet by mouth daily.   cetirizine (ZYRTEC) 10 MG tablet TAKE 1 TABLET BY MOUTH EACH MORNING AS NEEDED FOR ALLERGIES   cholecalciferol (VITAMIN D) 1000 UNITS tablet Take 1,000 Units by mouth daily.   citalopram (CELEXA) 40 MG tablet TAKE 1 TABLET BY MOUTH  DAILY   cloNIDine (CATAPRES) 0.1 MG tablet TAKE 1 TABLET BY MOUTH  DAILY   cloNIDine (CATAPRES) 0.1 MG tablet Take 1 tablet (0.1 mg total) by mouth daily.   ERGOTAMINE-PB-BELLADONNA PO Take by mouth 2 (two) times daily.   triamcinolone (NASACORT) 55 MCG/ACT AERO nasal inhaler Place 2 sprays into the nose daily.   No facility-administered medications prior to visit.    Review of Systems  Constitutional:  Negative for activity change and fatigue.  Eyes:  Positive for visual disturbance.  Respiratory:  Negative for cough and shortness of breath.   Cardiovascular:  Negative for chest pain, palpitations and leg swelling.  Musculoskeletal:  Negative for arthralgias and joint swelling.  Neurological:  Negative for dizziness, light-headedness and headaches.       Objective    BP (!) 153/100 (BP Location: Right Arm, Patient Position: Sitting, Cuff Size: Large)   Pulse (!) 57   Temp 98.2 F (36.8 C) (Oral)   Resp 16   Ht 5\' 3"  (1.6 m)   Wt 193 lb (87.5 kg)   SpO2 97%   BMI 34.19 kg/m  BP  Readings from Last 3 Encounters:  08/14/20 (!) 153/100  06/01/19 126/76  09/15/18 128/84   Wt Readings from Last 3 Encounters:  08/14/20 193 lb (87.5 kg)  06/01/19 183 lb 12.8 oz (83.4 kg)  09/15/18 184 lb (83.5 kg)       Physical Exam Vitals reviewed.  Constitutional:      Appearance: Normal appearance.  HENT:     Head: Normocephalic and atraumatic.  Eyes:     General: No scleral icterus.    Conjunctiva/sclera: Conjunctivae normal.  Pulmonary:     Effort: Pulmonary effort is normal. No respiratory distress.  Skin:    General: Skin is warm.  Neurological:     General: No focal  deficit present.     Mental Status: She is alert.  Psychiatric:        Mood and Affect: Mood normal.        Behavior: Behavior normal.        Thought Content: Thought content normal.      No results found for any visits on 08/14/20.  Assessment & Plan     1. Hypertension, unspecified type Started amlodipine 5 mg qd.  Follow-up with Dr. B in 1 to 2 months.  Check home blood pressures. - amLODipine (NORVASC) 5 MG tablet; Take 1 tablet (5 mg total) by mouth daily.  Dispense: 30 tablet; Refill: 1 2. Prediabetes Lab work brought from work shows a fasting glucose of 118.  Cholesterol 237 with an LDL of 113 and HDL of 106.  BMI this year is 33.5 which is up from 28.53 years ago.  Follow-up with Dr. B in the future for possible A1c.   Return in about 2 months (around 10/14/2020).         Bevelyn Arriola Cranford Mon, MD  [facility] [phone] (phone) [phone] (fax)  [place]

## 2020-08-14 ENCOUNTER — Other Ambulatory Visit: Payer: Self-pay

## 2020-08-14 ENCOUNTER — Encounter: Payer: Self-pay | Admitting: Family Medicine

## 2020-08-14 ENCOUNTER — Ambulatory Visit (INDEPENDENT_AMBULATORY_CARE_PROVIDER_SITE_OTHER): Payer: 59 | Admitting: Family Medicine

## 2020-08-14 VITALS — BP 153/100 | HR 57 | Temp 98.2°F | Resp 16 | Ht 63.0 in | Wt 193.0 lb

## 2020-08-14 DIAGNOSIS — I10 Essential (primary) hypertension: Secondary | ICD-10-CM | POA: Diagnosis not present

## 2020-08-14 DIAGNOSIS — R7303 Prediabetes: Secondary | ICD-10-CM | POA: Diagnosis not present

## 2020-08-14 MED ORDER — AMLODIPINE BESYLATE 5 MG PO TABS: 5.0000 mg | ORAL_TABLET | Freq: Every day | ORAL | 1 refills | Status: DC

## 2020-08-22 ENCOUNTER — Other Ambulatory Visit: Payer: Self-pay | Admitting: Family Medicine

## 2020-08-22 DIAGNOSIS — R232 Flushing: Secondary | ICD-10-CM

## 2020-08-22 NOTE — Telephone Encounter (Signed)
Requested medications are due for refill today.  yes  Requested medications are on the active medications list.  yes  Last refill. 08/05/2019  Future visit scheduled.   yes  Notes to clinic.  Prescription is expired.

## 2020-08-28 ENCOUNTER — Other Ambulatory Visit: Payer: Self-pay

## 2020-08-28 ENCOUNTER — Encounter: Payer: Self-pay | Admitting: Physician Assistant

## 2020-08-28 ENCOUNTER — Telehealth: Payer: Self-pay | Admitting: Family Medicine

## 2020-08-28 ENCOUNTER — Telehealth: Payer: 59 | Admitting: Physician Assistant

## 2020-08-28 DIAGNOSIS — J208 Acute bronchitis due to other specified organisms: Secondary | ICD-10-CM | POA: Diagnosis not present

## 2020-08-28 DIAGNOSIS — B9689 Other specified bacterial agents as the cause of diseases classified elsewhere: Secondary | ICD-10-CM | POA: Diagnosis not present

## 2020-08-28 DIAGNOSIS — F4329 Adjustment disorder with other symptoms: Secondary | ICD-10-CM

## 2020-08-28 MED ORDER — AZITHROMYCIN 250 MG PO TABS: ORAL_TABLET | ORAL | 0 refills | Status: DC

## 2020-08-28 MED ORDER — ALBUTEROL SULFATE HFA 108 (90 BASE) MCG/ACT IN AERS: 2.0000 | INHALATION_SPRAY | Freq: Four times a day (QID) | RESPIRATORY_TRACT | 0 refills | Status: DC | PRN

## 2020-08-28 MED ORDER — PREDNISONE 10 MG (21) PO TBPK: ORAL_TABLET | ORAL | 0 refills | Status: DC

## 2020-08-28 MED ORDER — CITALOPRAM HYDROBROMIDE 40 MG PO TABS: 40.0000 mg | ORAL_TABLET | Freq: Every day | ORAL | 0 refills | Status: DC

## 2020-08-28 MED ORDER — BENZONATATE 100 MG PO CAPS: 100.0000 mg | ORAL_CAPSULE | Freq: Three times a day (TID) | ORAL | 0 refills | Status: DC | PRN

## 2020-08-28 NOTE — Telephone Encounter (Signed)
[facility] faxed refill request for the following medications:  citalopram (CELEXA) 40 MG tablet  Last Rx: 08/05/19 LOV: 08/14/20 with Dr. Rosanna Randy  NOV: 10/19/20 with Dr. Rosanna Randy Please advise. Thanks TNP

## 2020-08-28 NOTE — Progress Notes (Signed)
Megan Bass, Megan Bass are scheduled for a virtual visit with your provider today.    Just as we do with appointments in the office, we must obtain your consent to participate.  Your consent will be active for this visit and any virtual visit you may have with one of our providers in the next 365 days.    If you have a MyChart account, I can also send a copy of this consent to you electronically.  All virtual visits are billed to your insurance company just like a traditional visit in the office.  As this is a virtual visit, video technology does not allow for your provider to perform a traditional examination.  This may limit your provider's ability to fully assess your condition.  If your provider identifies any concerns that need to be evaluated in person or the need to arrange testing such as labs, EKG, etc, we will make arrangements to do so.    Although advances in technology are sophisticated, we cannot ensure that it will always work on either your end or our end.  If the connection with a video visit is poor, we may have to switch to a telephone visit.  With either a video or telephone visit, we are not always able to ensure that we have a secure connection.   I need to obtain your verbal consent now.   Are you willing to proceed with your visit today?   Megan Bass has provided verbal consent on 08/28/2020 for a virtual visit (video or telephone).   Mar Daring, [name] 08/28/2020  8:47 AM  Virtual Visit Consent   Megan Bass, you are scheduled for a virtual visit with a [facility] provider today.     Just as with appointments in the office, your consent must be obtained to participate.  Your consent will be active for this visit and any virtual visit you may have with one of our providers in the next 365 days.     If you have a MyChart account, a copy of this consent can be sent to you electronically.  All virtual visits are billed to your insurance company just like a  traditional visit in the office.    As this is a virtual visit, video technology does not allow for your provider to perform a traditional examination.  This may limit your provider's ability to fully assess your condition.  If your provider identifies any concerns that need to be evaluated in person or the need to arrange testing (such as labs, EKG, etc.), we will make arrangements to do so.     Although advances in technology are sophisticated, we cannot ensure that it will always work on either your end or our end.  If the connection with a video visit is poor, the visit may have to be switched to a telephone visit.  With either a video or telephone visit, we are not always able to ensure that we have a secure connection.     I need to obtain your verbal consent now.   Are you willing to proceed with your visit today?    Megan Bass has provided verbal consent on 08/28/2020 for a virtual visit (video or telephone).   Mar Daring, [name]   Date: 08/28/2020 8:47 AM   Virtual Visit via Video Note   Megan Bass, connected [id number]@ ([id number], [birth year]) on 08/28/20 at  8:15 AM EDT by a video-enabled telemedicine application and verified that I am  speaking with the correct person using two identifiers.  Location: Patient: Virtual Visit Location Patient: Home Provider: Virtual Visit Location Provider: Home Office   I discussed the limitations of evaluation and management by telemedicine and the availability of in person appointments. The patient expressed understanding and agreed to proceed.    History of Present Illness: Megan Bass is a 55 y.o. who identifies as a female who was assigned female at birth, and is being seen today for cough, chest tightness and sore throat.  HPI: URI  This is a new problem. The current episode started 1 to 4 weeks ago. There has been no fever. Associated symptoms include chest pain (with coughing), congestion, coughing,  diarrhea (1st symptoms), headaches (with coughing), a sore throat and swollen glands (feels firm). Pertinent negatives include no ear pain, nausea, neck pain, plugged ear sensation, rhinorrhea, sinus pain, vomiting or wheezing. Associated symptoms comments: Has had sweats, body aches, hoarse voice. She has tried sleep and increased fluids (Mucinex DM, coricidin HBP cough/cold/flu) for the symptoms. The treatment provided no relief.   Covid testing negative x 2.  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    Allergies:  Allergies  Allergen Reactions   Iodides Itching   Povidone Iodine    Sulfa Antibiotics Rash   Medications:  Current Outpatient Medications:    albuterol (VENTOLIN HFA) 108 (90 Base) MCG/ACT inhaler, Inhale 2 puffs into the lungs every 6 (six) hours as needed for wheezing or shortness of breath., Disp: 8 g, Rfl: 0   azithromycin (ZITHROMAX) 250 MG tablet, Take 2 tablets PO on day one, and one tablet PO daily thereafter until completed., Disp: 6 tablet, Rfl: 0   benzonatate (TESSALON) 100 MG capsule, Take 1 capsule (100 mg total) by mouth 3 (three) times daily as needed., Disp: 30 capsule, Rfl: 0   cloNIDine (CATAPRES) 0.1 MG tablet, Take 1 tablet (0.1 mg total) by mouth daily., Disp: 90 tablet, Rfl: 1   predniSONE (STERAPRED UNI-PAK 21 TAB) 10 MG (21) TBPK tablet, 6 day taper; take as directed instructions, Disp: 21 tablet, Rfl: 0   amLODipine (NORVASC) 5 MG tablet, Take 1 tablet (5 mg total) by mouth daily., Disp: 30 tablet, Rfl: 1   Calcium  Carbonate-Vit D-Min (CALCIUM 1200 PO), Take 1 tablet by mouth daily., Disp: , Rfl:    cetirizine (ZYRTEC) 10 MG tablet, TAKE 1 TABLET BY MOUTH EACH MORNING AS NEEDED FOR ALLERGIES, Disp: , Rfl: 3   cholecalciferol (VITAMIN D) 1000 UNITS tablet, Take 1,000 Units by mouth daily., Disp: , Rfl:    citalopram (CELEXA) 40 MG tablet, TAKE 1 TABLET BY MOUTH  DAILY, Disp: 90 tablet, Rfl: 3   cloNIDine (CATAPRES) 0.1 MG tablet, TAKE 1 TABLET BY MOUTH  DAILY, Disp: 90 tablet, Rfl: 3   ERGOTAMINE-PB-BELLADONNA PO, Take by mouth 2 (two) times daily., Disp: , Rfl:    triamcinolone (NASACORT) 55 MCG/ACT AERO nasal inhaler, Place 2 sprays into the nose daily., Disp: , Rfl:   Observations/Objective: Patient is well-developed, well-nourished in no acute distress.  Resting comfortably  at home.  Head is normocephalic, atraumatic.  No labored breathing. Does have noticeable deep, harsh voice Dry, barking cough heard x 1 No adventitious lung sounds audibly heard through video Speech is clear and coherent with logical content.  Patient is alert and oriented at baseline.   Assessment and Plan: 1. Acute bacterial bronchitis - azithromycin (ZITHROMAX) 250 MG tablet; Take 2 tablets PO on day one, and one tablet PO daily thereafter until completed.  Dispense: 6 tablet; Refill: 0 - predniSONE (STERAPRED UNI-PAK 21 TAB) 10 MG (21) TBPK tablet; 6 day taper; take as directed instructions  Dispense: 21 tablet; Refill: 0 - albuterol (VENTOLIN HFA) 108 (90 Base) MCG/ACT inhaler; Inhale 2 puffs into the lungs every 6 (six) hours as needed for wheezing or shortness of breath.  Dispense: 8 g; Refill: 0 - benzonatate (TESSALON) 100 MG capsule; Take 1 capsule (100 mg total) by mouth 3 (three) times daily as needed.  Dispense: 30 capsule; Refill: 0 - Worsening despite OTC management and symptoms present > 7 days.  - Will treat with zpak, prednisone, albuterol and tessalon perles.  - Push fluids.  - Rest.  - Seek in person  evaluation if worsening or not improving.   Follow Up Instructions: I discussed the assessment and treatment plan with the patient. The patient was provided an opportunity to ask questions and all were answered. The patient agreed with the plan and demonstrated an understanding of the instructions.  A copy of instructions were sent to the patient via MyChart.  The patient was advised to call back or seek an in-person evaluation if the symptoms worsen or if the condition fails to improve as anticipated.  Time:  I spent 14 minutes with the patient via telehealth technology discussing the above problems/concerns.    Mar Daring, [name]

## 2020-08-28 NOTE — Patient Instructions (Addendum)
Deirdre Priest, thank you for joining Mar Daring, [name] for today's virtual visit.  While this provider is not your primary care provider (PCP), if your PCP is located in our provider database this encounter information will be shared with them immediately following your visit.  Consent: (Patient) Megan Bass provided verbal consent for this virtual visit at the beginning of the encounter.  Current Medications:  Current Outpatient Medications:    albuterol (VENTOLIN HFA) 108 (90 Base) MCG/ACT inhaler, Inhale 2 puffs into the lungs every 6 (six) hours as needed for wheezing or shortness of breath., Disp: 8 g, Rfl: 0   azithromycin (ZITHROMAX) 250 MG tablet, Take 2 tablets PO on day one, and one tablet PO daily thereafter until completed., Disp: 6 tablet, Rfl: 0   benzonatate (TESSALON) 100 MG capsule, Take 1 capsule (100 mg total) by mouth 3 (three) times daily as needed., Disp: 30 capsule, Rfl: 0   cloNIDine (CATAPRES) 0.1 MG tablet, Take 1 tablet (0.1 mg total) by mouth daily., Disp: 90 tablet, Rfl: 1   predniSONE (STERAPRED UNI-PAK 21 TAB) 10 MG (21) TBPK tablet, 6 day taper; take as directed instructions, Disp: 21 tablet, Rfl: 0   amLODipine (NORVASC) 5 MG tablet, Take 1 tablet (5 mg total) by mouth daily., Disp: 30 tablet, Rfl: 1   Calcium Carbonate-Vit D-Min (CALCIUM 1200 PO), Take 1 tablet by mouth daily., Disp: , Rfl:    cetirizine (ZYRTEC) 10 MG tablet, TAKE 1 TABLET BY MOUTH EACH MORNING AS NEEDED FOR ALLERGIES, Disp: , Rfl: 3   cholecalciferol (VITAMIN D) 1000 UNITS tablet, Take 1,000 Units by mouth daily., Disp: , Rfl:    citalopram (CELEXA) 40 MG tablet, TAKE 1 TABLET BY MOUTH  DAILY, Disp: 90 tablet, Rfl: 3   cloNIDine (CATAPRES) 0.1 MG tablet, TAKE 1 TABLET BY MOUTH  DAILY, Disp: 90 tablet, Rfl: 3   ERGOTAMINE-PB-BELLADONNA PO, Take by mouth 2 (two) times daily., Disp: , Rfl:    triamcinolone (NASACORT) 55 MCG/ACT AERO nasal inhaler, Place 2 sprays into the  nose daily., Disp: , Rfl:    Medications ordered in this encounter:  Meds ordered this encounter  Medications   azithromycin (ZITHROMAX) 250 MG tablet    Sig: Take 2 tablets PO on day one, and one tablet PO daily thereafter until completed.    Dispense:  6 tablet    Refill:  0    Order Specific Question:   Supervising Provider    Answer:   MILLER, BRIAN [[id number]]   predniSONE (STERAPRED UNI-PAK 21 TAB) 10 MG (21) TBPK tablet    Sig: 6 day taper; take as directed instructions    Dispense:  21 tablet    Refill:  0    Order Specific Question:   Supervising Provider    Answer:   Sabra Heck, BRIAN [[id number]]   albuterol (VENTOLIN HFA) 108 (90 Base) MCG/ACT inhaler    Sig: Inhale 2 puffs into the lungs every 6 (six) hours as needed for wheezing or shortness of breath.    Dispense:  8 g    Refill:  0    Order Specific Question:   Supervising Provider    Answer:   MILLER, BRIAN [[id number]]   benzonatate (TESSALON) 100 MG capsule    Sig: Take 1 capsule (100 mg total) by mouth 3 (three) times daily as needed.    Dispense:  30 capsule    Refill:  0    Order Specific Question:   Supervising Provider  Answer:   MILLER, [place]     *If you need refills on other medications prior to your next appointment, please contact your pharmacy*  Follow-Up: Call back or seek an in-person evaluation if the symptoms worsen or if the condition fails to improve as anticipated.  If you have been instructed to have an in-person evaluation today at a local Urgent Care facility, please use the link below. It will take you to a list of all of our available [facility], including address, phone number and hours of operation. Please do not delay care.  [facility]  If you or a family member do not have a primary care provider, use the link below to schedule a visit and establish care. When you choose a [facility] primary care physician or advanced practice provider, you gain a long-term partner  in health. Find a Primary Care Provider  Learn more about [facility] in-office and virtual care options: [facility]  Acute Bronchitis, Adult  Acute bronchitis is when air tubes in the lungs (bronchi) suddenly get swollen. The condition can make it hard for you to breathe. In adults, acute bronchitis usually goes away within 2 weeks. A cough caused by bronchitis may last up to 3 weeks. Smoking, allergies, and asthma can make thecondition worse. What are the causes? This condition is caused by: Cold and flu viruses. The most common cause of this condition is the virus that causes the common cold. Bacteria. Substances that irritate the lungs, including: Smoke from cigarettes and other types of tobacco. Dust and pollen. Fumes from chemicals, gases, or burned fuel. Other materials that pollute indoor or outdoor air. Close contact with someone who has acute bronchitis. What increases the risk? The following factors may make you more likely to develop this condition: A weak body's defense system. This is also called the immune system. Any condition that affects your lungs and breathing, such as asthma. What are the signs or symptoms? Symptoms of this condition include: A cough. Coughing up clear, yellow, or green mucus. Wheezing. Having too much mucus in your lungs (chest congestion). Shortness of breath. A fever. Chills. Body aches. A sore throat. How is this treated? Acute bronchitis may go away over time without treatment. Your doctor may recommend: Drinking more fluids. Using a device that gets medicine into your lungs (inhaler). Using a vaporizer or a humidifier. These are machines that add water or moisture to the air. This helps with coughing and poor breathing. Taking a medicine for fever. Taking a medicine that thins mucus and clears congestion. Taking a medicine that prevents or stops coughing. Follow these instructions at home: Activity Get a lot of  rest. Return to your normal activities as told by your doctor. Ask your doctor what activities are safe for you. Lifestyle  Drink enough fluid to keep your pee (urine) pale yellow. Do not drink alcohol. Do not use any products that contain nicotine or tobacco, such as cigarettes, e-cigarettes, and chewing tobacco. If you need help quitting, ask your doctor. Be aware that: Your bronchitis will get worse if you smoke or breathe in other people's smoke (secondhand smoke). Your lungs will heal faster if you quit smoking.  General instructions Take over-the-counter and prescription medicines only as told by your doctor. Use an inhaler, cool mist vaporizer, or humidifier as told by your doctor. Rinse your mouth often with salt water. To make salt water, dissolve -1 tsp (3-6 g) of salt  in 1 cup (237 mL) of warm water. Take two teaspoons of honey at bedtime. This helps lessen your coughing at night. Keep all follow-up visits as told by your doctor. This is important. How is this prevented? To lower your risk of getting this condition again: Wash your hands often with soap and water. If you cannot use soap and water, use hand sanitizer. Avoid contact with people who have cold symptoms. Try not to touch your mouth, nose, or eyes with your hands. Make sure to get the flu shot every year. Contact a doctor if: Your symptoms do not get better in 2 weeks. You vomit more than once or twice. You have symptoms of loss of fluid from your body (dehydration). These include: Dark pee. Dry skin or eyes. Increased thirst. Headaches. Confusion. Muscle cramps. Get help right away if: You cough up blood. You have chest pain. You have very bad shortness of breath. You become dehydrated. You faint or keep feeling like you are going to faint. You have a very bad headache. Your fever or chills get worse. These symptoms may be an emergency. Get help right away. Call your local emergency services (911 in the  [place] Do not wait to see if the symptoms will go away. Do not drive yourself to the hospital. Summary Acute bronchitis is when air tubes in the lungs (bronchi) suddenly get swollen. In adults, acute bronchitis usually goes away within 2 weeks. Take over-the-counter and prescription medicines only as told by your doctor. Drink enough fluid to keep your pee (urine) pale yellow. Contact a doctor if your symptoms do not improve after 2 weeks of treatment. Get help right away if you cough up blood, faint, or have chest pain or shortness of breath. This information is not intended to replace advice given to you by your health care provider. Make sure you discuss any questions you have with your healthcare provider. Document Revised: 01/19/2020 Document Reviewed: 09/11/2018 Elsevier Patient Education  [place].

## 2020-08-30 ENCOUNTER — Other Ambulatory Visit: Payer: Self-pay

## 2020-08-30 DIAGNOSIS — F4329 Adjustment disorder with other symptoms: Secondary | ICD-10-CM

## 2020-08-30 MED ORDER — CITALOPRAM HYDROBROMIDE 40 MG PO TABS: 40.0000 mg | ORAL_TABLET | Freq: Every day | ORAL | 0 refills | Status: DC

## 2020-08-30 NOTE — Telephone Encounter (Signed)
THIS MED WAS SENT TO [facility] , NEED TO BE SENT TO [facility]

## 2020-09-03 ENCOUNTER — Encounter: Payer: Self-pay | Admitting: Physician Assistant

## 2020-09-04 ENCOUNTER — Ambulatory Visit (INDEPENDENT_AMBULATORY_CARE_PROVIDER_SITE_OTHER): Payer: 59

## 2020-09-04 ENCOUNTER — Ambulatory Visit
Admission: EM | Admit: 2020-09-04 | Discharge: 2020-09-04 | Disposition: A | Discharge status: Home / Self Care | Payer: 59 | Attending: Emergency Medicine | Admitting: Emergency Medicine

## 2020-09-04 ENCOUNTER — Other Ambulatory Visit: Payer: Self-pay

## 2020-09-04 DIAGNOSIS — R059 Cough, unspecified: Secondary | ICD-10-CM | POA: Diagnosis not present

## 2020-09-04 DIAGNOSIS — R0602 Shortness of breath: Secondary | ICD-10-CM

## 2020-09-04 NOTE — Discharge Instructions (Addendum)
Your chest x-ray is normal.    Your COVID test is pending.  You should self quarantine until the test result is back.    Take Tylenol or ibuprofen as needed for fever or discomfort.  Rest and keep yourself hydrated.  Continue to use the albuterol inhaler as directed by your primary care provider.  Follow-up with your primary care provider if your symptoms are not improving.

## 2020-09-04 NOTE — ED Provider Notes (Signed)
Megan Bass    CSN: [id number] Arrival date & time: 09/04/20  1342      History   Chief Complaint Chief Complaint  Patient presents with   Cough    HPI Megan Bass is a 55 y.o. female.  Patient presents with cough productive of green sputum, shortness of breath, fatigue x2 weeks.  She denies fever, vomiting, diarrhea, or other symptoms.  She had a video visit with her PCP on 08/28/2020; diagnosed with acute bronchitis; treated with Zithromax, prednisone, albuterol inhaler, Tessalon.  Her medical history includes seasonal allergies, breast cancer, migraine headaches, anxiety, depression.  The history is provided by the patient and medical records.   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   Breast neoplasm, Tis (LCIS) 09/02/2013    Past Surgical History:  Procedure Laterality Date   ABDOMINAL HYSTERECTOMY  02/2012   BREAST BIOPSY Right 6.22.15   LOBULAR CARCINOMA IN SITU/ATYPICAL DUCTAL HYPERPLASIA    BREAST BIOPSY Right 09/10/2013   fibrocystic change and pseudoangiomatous stromal   BREAST EXCISIONAL BIOPSY Right 10/14/2013   breast ca right    BREAST LUMPECTOMY Right 2015   LCIS   BREAST SURGERY Right 10/14/13   lumpectomy   ENDOMETRIAL ABLATION     ingrown toe nail  2012   NASAL SINUS SURGERY  11/2011   OTHER SURGICAL HISTORY     color guard done 2018   TUBAL LIGATION   1994   Dr.Washington    OB History     Gravida  1   Para  1   Term  1   Preterm      AB      Living  1      SAB      IAB      Ectopic      Multiple      Live Births  1        Obstetric Comments  1st Menstrual Cycle:  15 1st Pregnancy:  22          Home Medications    Prior to Admission medications   Medication Sig Start Date End Date Taking? Authorizing Provider  cloNIDine (CATAPRES) 0.1 MG tablet Take 1 tablet (0.1 mg total) by mouth daily. 08/23/20   Jerrol Banana., MD  albuterol (VENTOLIN HFA) [phone] 0078 Base) MCG/ACT inhaler Inhale 2 puffs into the lungs every 6 (six) hours as needed for wheezing or shortness of breath. 08/28/20   Mar Daring, [name]  amLODipine (NORVASC) 5 MG tablet Take 1 tablet (5 mg total) by mouth daily. 08/14/20   Jerrol Banana., MD  azithromycin (ZITHROMAX) 250 MG tablet Take 2 tablets PO on day one, and one tablet PO daily thereafter until completed. 08/28/20   Mar Daring, [name]  benzonatate (TESSALON) 100 MG capsule Take 1 capsule (100 mg total) by mouth 3 (three) times  daily as needed. 08/28/20   Mar Daring, [name]  Calcium Carbonate-Vit D-Min (CALCIUM 1200 PO) Take 1 tablet by mouth daily.    [provider]  cetirizine (ZYRTEC) 10 MG tablet TAKE 1 TABLET BY MOUTH EACH MORNING AS NEEDED FOR ALLERGIES 01/29/18   [provider]  cholecalciferol (VITAMIN D) 1000 UNITS tablet Take 1,000 Units by mouth daily.    [provider]  citalopram (CELEXA) 40 MG tablet Take 1 tablet (40 mg total) by mouth daily. 08/30/20   Virginia Crews, MD  cloNIDine (CATAPRES) 0.1 MG tablet TAKE 1 TABLET BY MOUTH  DAILY 08/05/19   Trinna Post, [name]  ERGOTAMINE-PB-BELLADONNA PO Take by mouth 2 (two) times daily.    [provider]  predniSONE (STERAPRED UNI-PAK 21 TAB) 10 MG (21) TBPK tablet 6 day taper; take as directed instructions 08/28/20   Mar Daring, [name]   triamcinolone (NASACORT) 55 MCG/ACT AERO nasal inhaler Place 2 sprays into the nose daily.    [provider]    Family History Family History  Problem Relation Age of Onset   Hypertension Mother    COPD Father    Hypertension Brother    Cancer Neg Hx    Breast cancer Neg Hx     Social History Social History   Tobacco Use   Smoking status: Never   Smokeless tobacco: Never  Vaping Use   Vaping Use: Never used  Substance Use Topics   Alcohol use: Yes    Alcohol/week: 6.0 standard drinks    Types: 6 Glasses of wine per week   Drug use: No     Allergies   Iodides, Povidone iodine, and Sulfa antibiotics   Review of Systems Review of Systems  Constitutional:  Positive for fatigue. Negative for chills and fever.  HENT:  Positive for congestion. Negative for ear pain and sore throat.   Respiratory:  Positive for cough and shortness of breath.   Cardiovascular:  Negative for chest pain and palpitations.  Gastrointestinal:  Negative for abdominal pain, diarrhea and vomiting.  Musculoskeletal:  Negative for back pain.  Skin:  Negative for color change and rash.  All other systems reviewed and are negative.   Physical Exam Triage Vital Signs ED Triage Vitals  Enc Vitals Group     BP 09/04/20 1401 (!) 133/92     Pulse Rate 09/04/20 1401 76     Resp 09/04/20 1401 16     Temp 09/04/20 1401 99 F (37.2 C)     Temp Source 09/04/20 1401 Oral     SpO2 09/04/20 1401 98 %     Weight 09/04/20 1405 190 lb (86.2 kg)     Height 09/04/20 1405 5\' 2"  (1.575 m)     Head Circumference --      Peak Flow --      Pain Score 09/04/20 1405 0     Pain Loc --      Pain Edu? --      Excl. in [place]? --    No data found.  Updated Vital Signs BP (!) 133/92   Pulse 76   Temp 99 F (37.2 C) (Oral)   Resp 16   Ht 5\' 2"  (1.575 m)   Wt 190 lb (86.2 kg)   SpO2 98%   BMI 34.75 kg/m   Visual Acuity Right Eye Distance:   Left Eye Distance:   Bilateral Distance:    Right Eye  Near:   Left Eye Near:  Bilateral Near:     Physical Exam Vitals and nursing note reviewed.  Constitutional:      General: She is not in acute distress.    Appearance: She is well-developed.  HENT:     Head: Normocephalic and atraumatic.     Right Ear: Tympanic membrane normal.     Left Ear: Tympanic membrane normal.     Nose: Nose normal.     Mouth/Throat:     Mouth: Mucous membranes are moist.     Pharynx: Oropharynx is clear.  Eyes:     Conjunctiva/sclera: Conjunctivae normal.  Cardiovascular:     Rate and Rhythm: Normal rate and regular rhythm.     Heart sounds: Normal heart sounds.  Pulmonary:     Effort: Pulmonary effort is normal. No respiratory distress.     Breath sounds: Normal breath sounds.  Abdominal:     Palpations: Abdomen is soft.     Tenderness: There is no abdominal tenderness.  Musculoskeletal:     Cervical back: Neck supple.  Skin:    General: Skin is warm and dry.  Neurological:     General: No focal deficit present.     Mental Status: She is alert and oriented to person, place, and time.     Gait: Gait normal.  Psychiatric:        Mood and Affect: Mood normal.        Behavior: Behavior normal.     UC Treatments / Results  Labs (all labs ordered are listed, but only abnormal results are displayed) Labs Reviewed  NOVEL CORONAVIRUS, NAA    EKG   Radiology DG Chest 2 View  Result Date: 09/04/2020 CLINICAL DATA:  Cough, shortness of breath. EXAM: CHEST - 2 VIEW COMPARISON:  February 15, 2016. FINDINGS: The heart size and mediastinal contours are within normal limits. Both lungs are clear. The visualized skeletal structures are unremarkable. IMPRESSION: No active cardiopulmonary disease. Electronically Signed   By: Marijo Conception M.D.   On: 09/04/2020 14:52    Procedures Procedures (including critical care time)  Medications Ordered in UC Medications - No data to display  Initial Impression / Assessment and Plan / UC Course  I have  reviewed the triage vital signs and the nursing notes.  Pertinent labs & imaging results that were available during my care of the patient were reviewed by me and considered in my medical decision making (see chart for details).  Cough, shortness of breath.  Chest x-ray negative.  COVID pending.  Instructed patient to self quarantine per CDC guidelines.  Instructed patient to continue using her albuterol inhaler as directed.  She completed the course of Zithromax and prednisone prescribed by her PCP.  Discussed symptomatic treatment including Tylenol or ibuprofen, rest, hydration.  Instructed patient to follow up with PCP if symptoms are not improving.  Patient agrees to plan of care.    Final Clinical Impressions(s) / UC Diagnoses   Final diagnoses:  Cough  SOB (shortness of breath)     Discharge Instructions      Your chest x-ray is normal.    Your COVID test is pending.  You should self quarantine until the test result is back.    Take Tylenol or ibuprofen as needed for fever or discomfort.  Rest and keep yourself hydrated.  Continue to use the albuterol inhaler as directed by your primary care provider.  Follow-up with your primary care provider if your symptoms are not improving.  ED Prescriptions   None    PDMP not reviewed this encounter.   Sharion Balloon, NP 09/04/20 [phone]

## 2020-09-04 NOTE — ED Triage Notes (Signed)
Pt reports having a productive cough x2 weeks. Also reports feeling SOB and fatigue.  Had an virtual visit on 6/27 and dx with bronchitis. Sts she was prescribed abx and prednisone. Spoke with PCP today and advised to come in for evaluation.

## 2020-09-05 ENCOUNTER — Other Ambulatory Visit: Payer: Self-pay | Admitting: Family Medicine

## 2020-09-05 DIAGNOSIS — I10 Essential (primary) hypertension: Secondary | ICD-10-CM

## 2020-09-06 LAB — SARS-COV-2, NAA 2 DAY TAT

## 2020-09-06 LAB — NOVEL CORONAVIRUS, NAA: SARS-CoV-2, NAA: NOT DETECTED

## 2020-09-30 ENCOUNTER — Other Ambulatory Visit: Payer: Self-pay | Admitting: Family Medicine

## 2020-09-30 DIAGNOSIS — I10 Essential (primary) hypertension: Secondary | ICD-10-CM

## 2020-09-30 NOTE — Telephone Encounter (Signed)
last RF 09/05/20 #30 1 RF

## 2020-10-19 ENCOUNTER — Ambulatory Visit (INDEPENDENT_AMBULATORY_CARE_PROVIDER_SITE_OTHER): Payer: 59 | Admitting: Family Medicine

## 2020-10-19 ENCOUNTER — Encounter: Payer: Self-pay | Admitting: Family Medicine

## 2020-10-19 ENCOUNTER — Other Ambulatory Visit: Payer: Self-pay

## 2020-10-19 VITALS — BP 120/78 | HR 73 | Temp 98.6°F | Wt 195.0 lb

## 2020-10-19 DIAGNOSIS — I10 Essential (primary) hypertension: Secondary | ICD-10-CM

## 2020-10-19 DIAGNOSIS — N6091 Unspecified benign mammary dysplasia of right breast: Secondary | ICD-10-CM

## 2020-10-19 DIAGNOSIS — F4329 Adjustment disorder with other symptoms: Secondary | ICD-10-CM | POA: Diagnosis not present

## 2020-10-19 DIAGNOSIS — G43119 Migraine with aura, intractable, without status migrainosus: Secondary | ICD-10-CM

## 2020-10-19 MED ORDER — AMLODIPINE BESYLATE 5 MG PO TABS: 5.0000 mg | ORAL_TABLET | Freq: Every day | ORAL | 1 refills | Status: DC

## 2020-10-19 NOTE — Progress Notes (Signed)
Established patient visit   Patient: Megan Bass   DOB: [birth year]   55 y.o. Female  MRN: [id number] Visit Date: 10/19/2020  Today's healthcare provider: Wilhemena Durie, MD   No chief complaint on file.  Subjective  -------------------------------------------------------------------------------------------------------------------- HPI  Patient feels well and has no complaints.  Medications are unchanged. Hypertension, follow-up  BP Readings from Last 3 Encounters:  10/19/20 120/78  09/04/20 (!) 133/92  08/14/20 (!) 153/100   Wt Readings from Last 3 Encounters:  10/19/20 195 lb (88.5 kg)  09/04/20 190 lb (86.2 kg)  08/14/20 193 lb (87.5 kg)     She was last seen for hypertension 2 months ago.  BP at that visit was 153/100. Management since that visit includes amlodpine '5mg'$ .  She reports excellent compliance with treatment. She is not having side effects.  She is following a Low Sodium diet. She is exercising. She does not smoke.  Use of agents associated with hypertension: none.   Outside blood pressures are 120's/70-80's. Symptoms: No chest pain No chest pressure  No palpitations No syncope  No dyspnea No orthopnea  No paroxysmal nocturnal dyspnea No lower extremity edema   Pertinent labs: No results found for: CHOL, HDL, LDLCALC, LDLDIRECT, TRIG, CHOLHDL Lab Results  Component Value Date   NA 138 06/01/2019   K 4.1 06/01/2019   CREATININE 0.91 06/01/2019   GFRNONAA 72 06/01/2019   GFRAA 83 06/01/2019   GLUCOSE 88 06/01/2019     The ASCVD Risk score Mikey Bussing DC Jr., et al., 2013) failed to calculate for the following reasons:   Cannot find a previous HDL lab   Cannot find a previous total cholesterol lab   ---------------------------------------------------------------------------------------------------      Medications: Outpatient Medications Prior to Visit  Medication Sig   albuterol (VENTOLIN HFA) 108 (90 Base) MCG/ACT inhaler  Inhale 2 puffs into the lungs every 6 (six) hours as needed for wheezing or shortness of breath.   amLODipine (NORVASC) 5 MG tablet TAKE 1 TABLET (5 MG TOTAL) BY MOUTH DAILY.   Calcium Carbonate-Vit D-Min (CALCIUM 1200 PO) Take 1 tablet by mouth daily.   cetirizine (ZYRTEC) 10 MG tablet TAKE 1 TABLET BY MOUTH EACH MORNING AS NEEDED FOR ALLERGIES   cholecalciferol (VITAMIN D) 1000 UNITS tablet Take 1,000 Units by mouth daily.   citalopram (CELEXA) 40 MG tablet Take 1 tablet (40 mg total) by mouth daily.   cloNIDine (CATAPRES) 0.1 MG tablet Take 1 tablet (0.1 mg total) by mouth daily.   ERGOTAMINE-PB-BELLADONNA PO Take by mouth 2 (two) times daily.   triamcinolone (NASACORT) 55 MCG/ACT AERO nasal inhaler Place 2 sprays into the nose daily.   azithromycin (ZITHROMAX) 250 MG tablet Take 2 tablets PO on day one, and one tablet PO daily thereafter until completed.   benzonatate (TESSALON) 100 MG capsule Take 1 capsule (100 mg total) by mouth 3 (three) times daily as needed.   cloNIDine (CATAPRES) 0.1 MG tablet TAKE 1 TABLET BY MOUTH  DAILY   predniSONE (STERAPRED UNI-PAK 21 TAB) 10 MG (21) TBPK tablet 6 day taper; take as directed instructions   No facility-administered medications prior to visit.    Review of Systems  Constitutional: Negative.   Respiratory: Negative.    Cardiovascular: Negative.   Gastrointestinal: Negative.   Neurological:  Negative for dizziness, syncope, speech difficulty, light-headedness, numbness and headaches.      Objective  -------------------------------------------------------------------------------------------------------------------- BP 120/78 (BP Location: Left Arm, Patient Position: Sitting, Cuff Size: Large)  Pulse 73   Temp 98.6 F (37 C) (Oral)   Wt 195 lb (88.5 kg)   SpO2 97%   BMI 35.67 kg/m     Physical Exam Vitals reviewed.  Constitutional:      Appearance: Normal appearance.  HENT:     Head: Normocephalic and atraumatic.  Eyes:      General: No scleral icterus.    Conjunctiva/sclera: Conjunctivae normal.  Pulmonary:     Effort: Pulmonary effort is normal. No respiratory distress.  Skin:    General: Skin is warm.  Neurological:     General: No focal deficit present.     Mental Status: She is alert.  Psychiatric:        Mood and Affect: Mood normal.        Behavior: Behavior normal.        Thought Content: Thought content normal.        Judgment: Judgment normal.      No results found for any visits on 10/19/20.  Assessment & Plan  ---------------------------------------------------------------------------------------------------------------------- 1. Hypertension, unspecified type Good control on amlodipine and clonidine. - amLODipine (NORVASC) 5 MG tablet; Take 1 tablet (5 mg total) by mouth daily.  Dispense: 90 tablet; Refill: 1  2. Intractable migraine with aura without status migrainosus On an ergot belladonna combination for several years  3. Adjustment disorder with other symptom Citalopram  4. Atypical ductal hyperplasia of right breast    No follow-ups on file.      I, Wilhemena Durie, MD, have reviewed all documentation for this visit. The documentation on 10/29/20 for the exam, diagnosis, procedures, and orders are all accurate and complete.    Taz Vanness Cranford Mon, MD  [facility] [phone] (phone) [phone] (fax)  [place]

## 2020-11-02 IMAGING — CT CT MAXILLOFACIAL WITHOUT CONTRAST
3 of 5 series · 16 of 47 positions shown, 19 images · non-contrast
Comparison: None.

CLINICAL DATA: Nasal drainage and nose bleed for the past 24 hours.

EXAM:
CT MAXILLOFACIAL WITHOUT CONTRAST
TECHNIQUE: Multidetector CT imaging of the maxillofacial structures was
performed. Multiplanar CT image reconstructions were also generated.

[Series 2: max soft · axial · 0.33mm/px · z∈[-118,+12]mm · 11 of 77 slices shown, 14 images]
[im 6/77  brain]
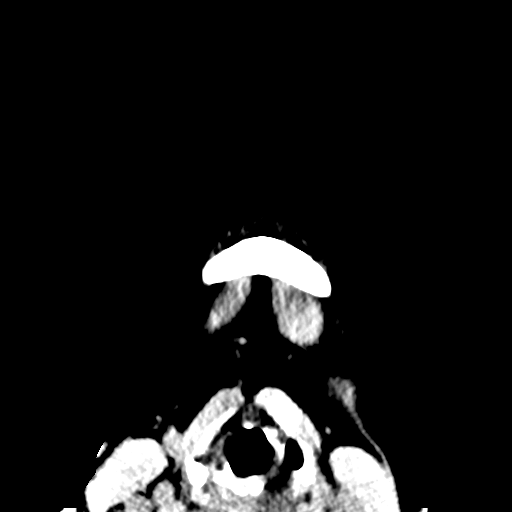
[im 6/77  bone]
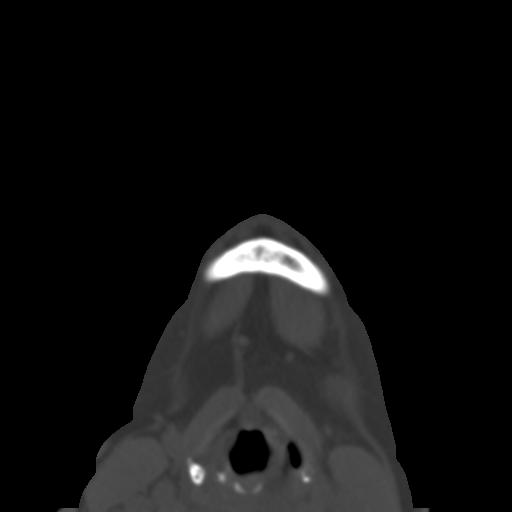
[im 11/77  bone]
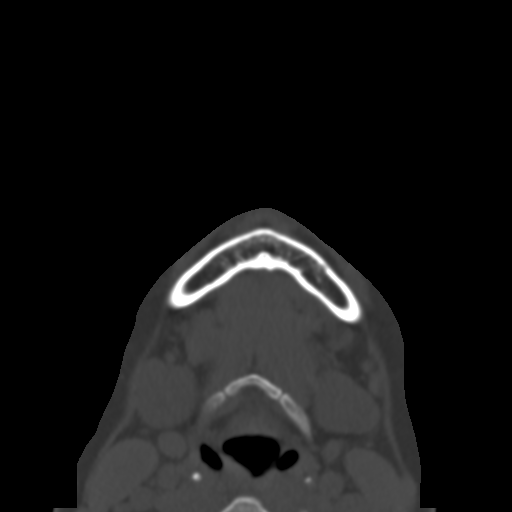
[im 19/77  bone]
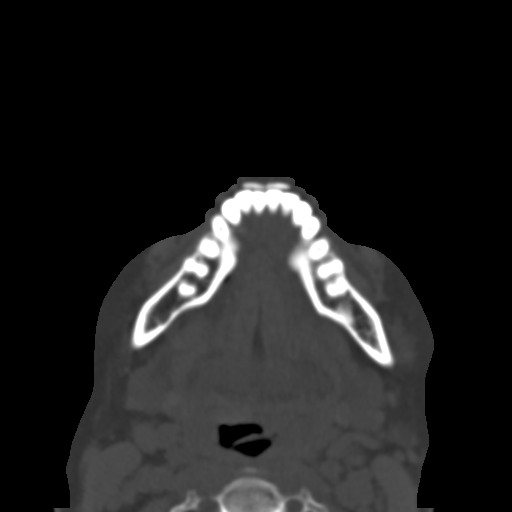
[im 24/77  bone]
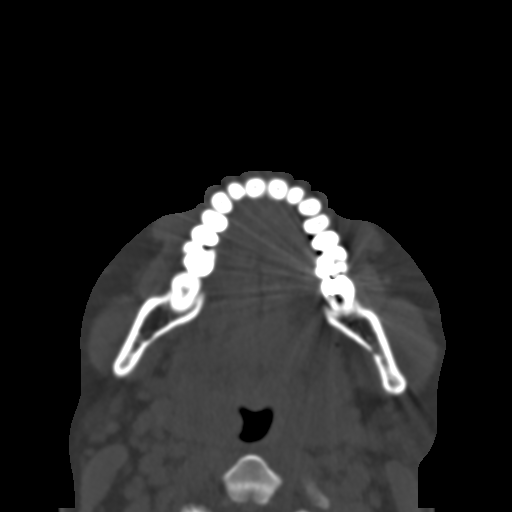
[im 32/77  brain]
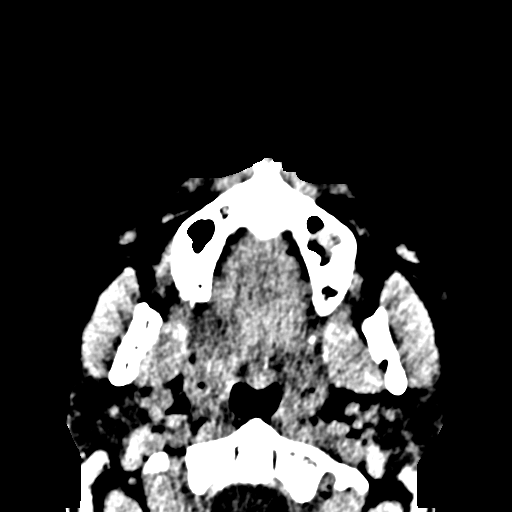
[im 32/77  bone]
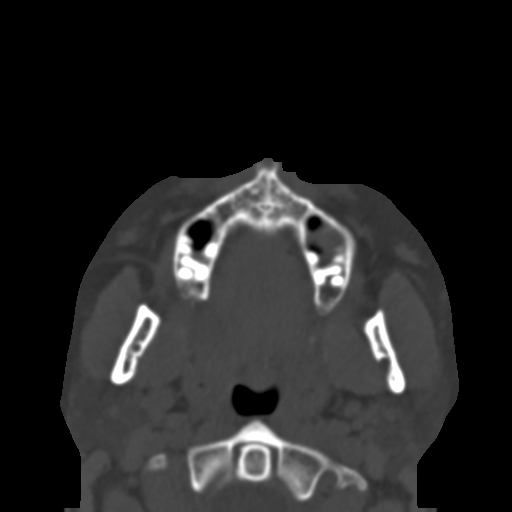
[im 40/77  bone]
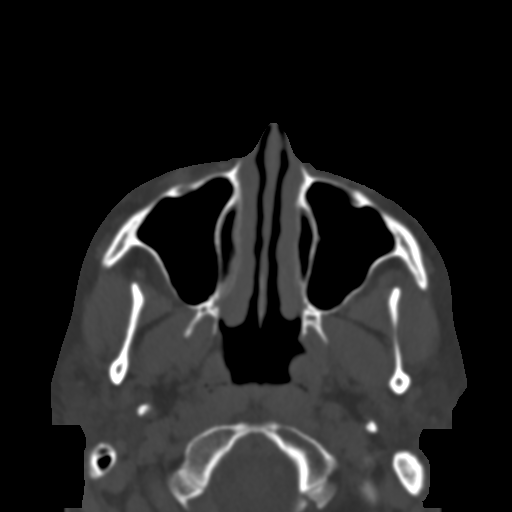
[im 45/77  bone]
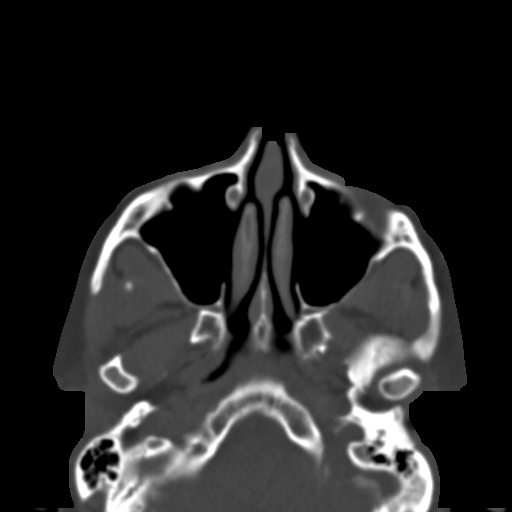
[im 53/77  bone]
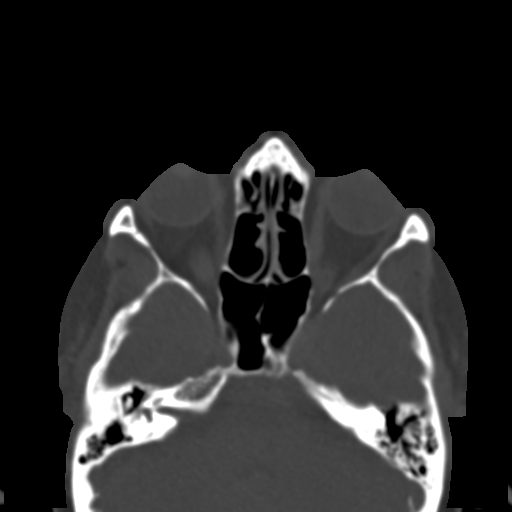
[im 58/77  brain]
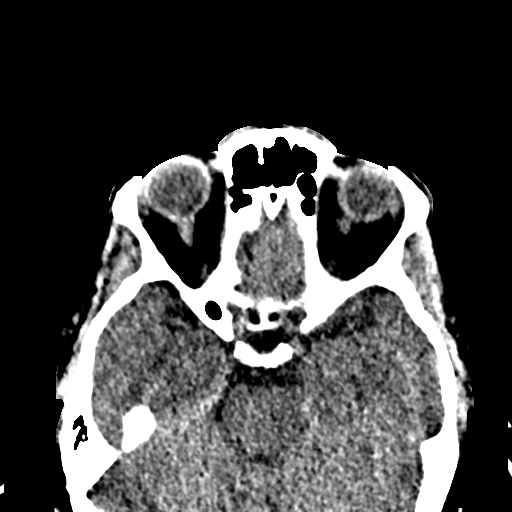
[im 58/77  bone]
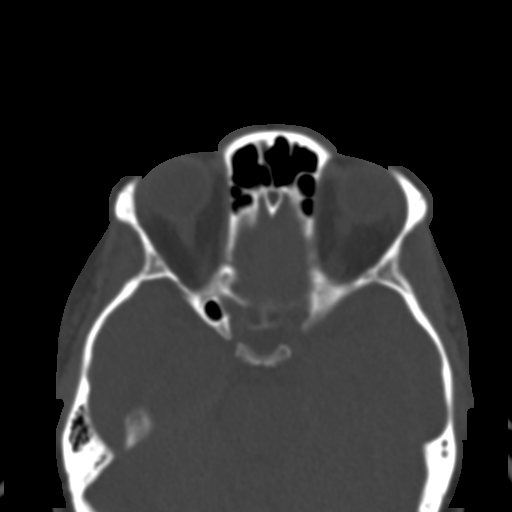
[im 66/77  bone]
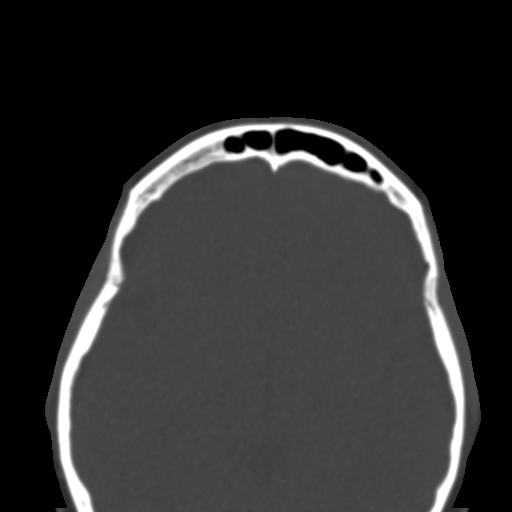
[im 71/77  bone]
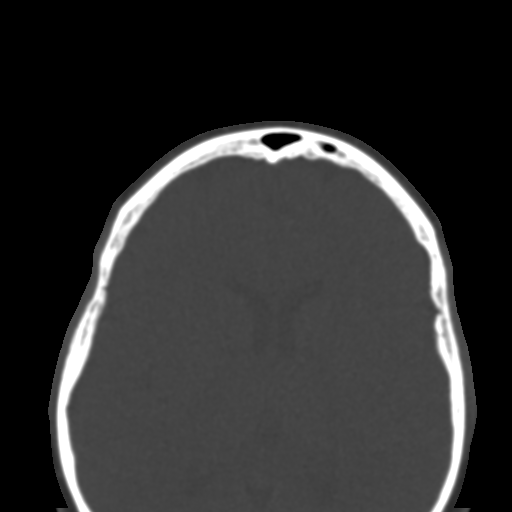

[Series 8: coronal bone · coronal · 0.33mm/px · 3 of 69 slices shown]
[im 18/69  bone]
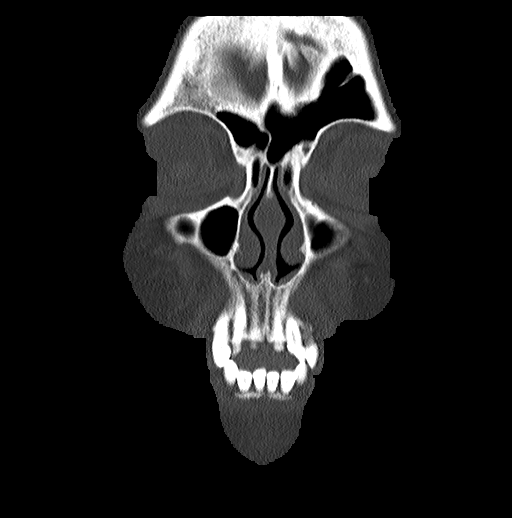
[im 35/69  bone]
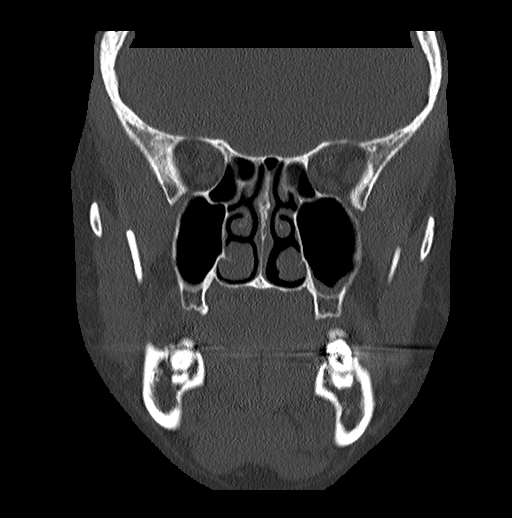
[im 52/69  bone]
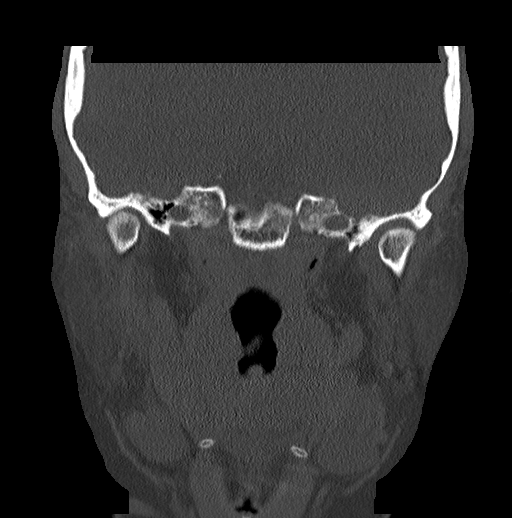

[Series 9: sagittal bone · sagittal · 0.32mm/px · 2 of 77 slices shown]
[im 26/77  bone]
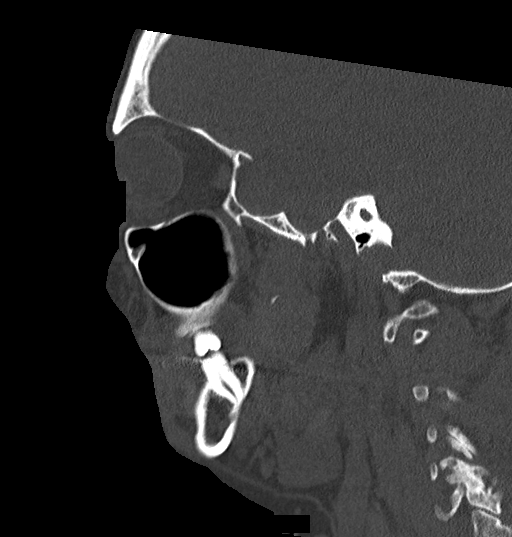
[im 51/77  bone]
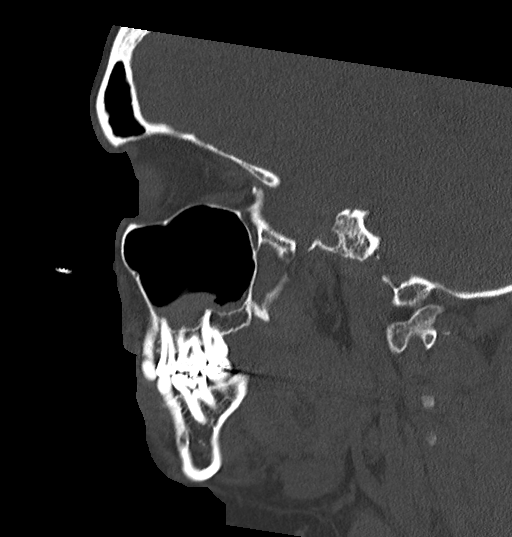

[16 of 47 positions shown; findings below may reference images not displayed]

FINDINGS: Osseous: No fracture or mandibular dislocation. No destructive
process. Moderate right-sided facet arthropathy in the visualized
upper cervical spine.

Orbits: Negative. No traumatic or inflammatory finding.

Sinuses: Evidence of prior FESS with bilateral ethmoidectomies and
maxillary antrostomies. Small retention cyst in the left maxillary
sinus. No significant sinus mucosal thickening. No air-fluid levels.
The mastoid air cells are clear.

Soft tissues: Negative.

Limited intracranial: No significant or unexpected finding.
IMPRESSION: 1. Prior FESS. No significant sinus mucosal thickening. No air-fluid
levels.

## 2020-11-03 ENCOUNTER — Other Ambulatory Visit: Payer: Self-pay | Admitting: Family Medicine

## 2020-11-03 DIAGNOSIS — I10 Essential (primary) hypertension: Secondary | ICD-10-CM

## 2020-12-27 ENCOUNTER — Other Ambulatory Visit: Payer: Self-pay | Admitting: Family Medicine

## 2020-12-27 DIAGNOSIS — Z1231 Encounter for screening mammogram for malignant neoplasm of breast: Secondary | ICD-10-CM

## 2020-12-29 ENCOUNTER — Other Ambulatory Visit: Payer: Self-pay | Admitting: Family Medicine

## 2020-12-29 DIAGNOSIS — F4329 Adjustment disorder with other symptoms: Secondary | ICD-10-CM

## 2020-12-30 NOTE — Telephone Encounter (Signed)
Requested Prescriptions  Pending Prescriptions Disp Refills  . citalopram (CELEXA) 40 MG tablet [Pharmacy Med Name: Citalopram Hydrobromide 40 MG Oral Tablet] 90 tablet 0    Sig: TAKE 1 TABLET BY MOUTH  DAILY     Psychiatry:  Antidepressants - SSRI Passed - 12/29/2020 11:32 PM      Passed - Valid encounter within last 6 months    Recent Outpatient Visits          2 months ago Intractable migraine with aura without status migrainosus   [facility] Jerrol Banana., MD   4 months ago Hypertension, unspecified type   [facility] Jerrol Banana., MD   12 months ago Vertigo   [facility], Wendee Beavers, [place]   1 year ago [place], Wendee Beavers, [place]   1 year ago Hot flashes   [place], Wendee Beavers, [place]      Future Appointments            In 3 months Bacigalupo, Dionne Bucy, MD [facility], [place]

## 2021-01-15 ENCOUNTER — Ambulatory Visit
Admission: RE | Admit: 2021-01-15 | Discharge: 2021-01-15 | Disposition: A | Discharge status: Home / Self Care | Payer: 59 | Source: Ambulatory Visit | Attending: Family Medicine | Admitting: Family Medicine

## 2021-01-15 ENCOUNTER — Other Ambulatory Visit: Payer: Self-pay

## 2021-01-15 DIAGNOSIS — Z1231 Encounter for screening mammogram for malignant neoplasm of breast: Secondary | ICD-10-CM | POA: Diagnosis not present

## 2021-02-05 ENCOUNTER — Other Ambulatory Visit: Payer: Self-pay | Admitting: Family Medicine

## 2021-02-05 DIAGNOSIS — R232 Flushing: Secondary | ICD-10-CM

## 2021-02-06 ENCOUNTER — Other Ambulatory Visit: Payer: Self-pay | Admitting: Family Medicine

## 2021-02-06 DIAGNOSIS — F4329 Adjustment disorder with other symptoms: Secondary | ICD-10-CM

## 2021-02-06 NOTE — Telephone Encounter (Signed)
Requested Prescriptions  Pending Prescriptions Disp Refills  . cloNIDine (CATAPRES) 0.1 MG tablet [Pharmacy Med Name: cloNIDine HCl 0.1 MG Oral Tablet] 90 tablet 0    Sig: TAKE 1 TABLET BY MOUTH  DAILY     Cardiovascular:  Alpha-2 Agonists Passed - 02/05/2021 10:13 PM      Passed - Last BP in normal range    BP Readings from Last 1 Encounters:  10/19/20 120/78         Passed - Last Heart Rate in normal range    Pulse Readings from Last 1 Encounters:  10/19/20 73         Passed - Valid encounter within last 6 months    Recent Outpatient Visits          3 months ago Intractable migraine with aura without status migrainosus   [facility] Jerrol Banana., MD   5 months ago Hypertension, unspecified type   [facility] Jerrol Banana., MD   1 year ago [place], Wendee Beavers, [place]   1 year ago [place], Wendee Beavers, [place]   1 year ago Hot flashes   [place], Wendee Beavers, [place]      Future Appointments            In 2 months Bacigalupo, Dionne Bucy, MD [facility], [place]

## 2021-03-20 ENCOUNTER — Other Ambulatory Visit: Payer: Self-pay | Admitting: Family Medicine

## 2021-03-20 DIAGNOSIS — F4329 Adjustment disorder with other symptoms: Secondary | ICD-10-CM

## 2021-03-20 NOTE — Telephone Encounter (Signed)
Requested Prescriptions  Pending Prescriptions Disp Refills   citalopram (CELEXA) 40 MG tablet [Pharmacy Med Name: Citalopram Hydrobromide 40 MG Oral Tablet] 90 tablet 3    Sig: TAKE 1 TABLET BY MOUTH DAILY     Psychiatry:  Antidepressants - SSRI Passed - 03/20/2021 10:41 PM      Passed - Valid encounter within last 6 months    Recent Outpatient Visits          5 months ago Intractable migraine with aura without status migrainosus   [facility] Jerrol Banana., MD   7 months ago Hypertension, unspecified type   [facility] Jerrol Banana., MD   1 year ago [place], Wendee Beavers, [place]   1 year ago [place], Wendee Beavers, [place]   1 year ago Hot flashes   [place], Wendee Beavers, [place]      Future Appointments            In 1 month Bacigalupo, Dionne Bucy, MD [facility], [place]

## 2021-04-03 ENCOUNTER — Encounter: Payer: Self-pay | Admitting: Family Medicine

## 2021-04-05 ENCOUNTER — Other Ambulatory Visit: Payer: Self-pay | Admitting: Family Medicine

## 2021-04-05 DIAGNOSIS — I10 Essential (primary) hypertension: Secondary | ICD-10-CM

## 2021-04-09 ENCOUNTER — Ambulatory Visit: Payer: 59 | Admitting: Family Medicine

## 2021-04-19 NOTE — Progress Notes (Signed)
Established patient visit   Patient: Megan Bass   DOB: 1966-01-04   56 y.o. Female  MRN: [id number] Visit Date: 04/20/2021  Today's healthcare provider: Lavon Paganini, MD   Chief Complaint  Patient presents with   Hypertension   Subjective    HPI  Hypertension, follow-up  BP Readings from Last 3 Encounters:  04/20/21 125/78  10/19/20 120/78  09/04/20 (!) 133/92   Wt Readings from Last 3 Encounters:  04/20/21 196 lb (88.9 kg)  10/19/20 195 lb (88.5 kg)  09/04/20 190 lb (86.2 kg)     She was last seen for hypertension 6 months ago.  BP at that visit was as above. Management since that visit includes no changes.  She reports good compliance with treatment. She is not having side effects.  She is following a Regular diet. She is not exercising. She does not smoke.  Use of agents associated with hypertension: none.   Outside blood pressures are 123-168 over 82-104 with her averaging 150's over 90.Marland Kitchen Symptoms: No chest pain No chest pressure  No palpitations No syncope  No dyspnea No orthopnea  No paroxysmal nocturnal dyspnea No lower extremity edema   Pertinent labs: No results found for: CHOL, HDL, LDLCALC, LDLDIRECT, TRIG, CHOLHDL Lab Results  Component Value Date   NA 138 06/01/2019   K 4.1 06/01/2019   CREATININE 0.91 06/01/2019   GFRNONAA 72 06/01/2019   GLUCOSE 88 06/01/2019   TSH 3.260 06/01/2019     The ASCVD Risk score (Arnett DK, et al., 2019) failed to calculate for the following reasons:   Cannot find a previous HDL lab   Cannot find a previous total cholesterol lab   --------------------------------------------------------------------------------------------------- Added black cohosh for hot flashes and it is helpful. Taking egro-belladonna compounded med also. 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 Excedrin migraine helped  Low libido - s/p hysterectomy, menopausal, on  celexa  Medications: Outpatient Medications Prior to Visit  Medication Sig   amLODipine (NORVASC) 5 MG tablet TAKE 1 TABLET BY MOUTH  DAILY   cetirizine (ZYRTEC) 10 MG tablet TAKE 1 TABLET BY MOUTH EACH MORNING AS NEEDED FOR ALLERGIES   citalopram (CELEXA) 40 MG tablet TAKE 1 TABLET BY MOUTH DAILY   cloNIDine (CATAPRES) 0.1 MG tablet TAKE 1 TABLET BY MOUTH  DAILY   ERGOTAMINE-PB-BELLADONNA PO Take by mouth 2 (two) times daily.   triamcinolone (NASACORT) 55 MCG/ACT AERO nasal inhaler Place 2 sprays into the nose daily.   [DISCONTINUED] albuterol (VENTOLIN HFA) 108 (90 Base) MCG/ACT inhaler Inhale 2 puffs into the lungs every 6 (six) hours as needed for wheezing or shortness of breath.   [DISCONTINUED] azithromycin (ZITHROMAX) 250 MG tablet Take 2 tablets PO on day one, and one tablet PO daily thereafter until completed.   [DISCONTINUED] benzonatate (TESSALON) 100 MG capsule Take 1 capsule (100 mg total) by mouth 3 (three) times daily as needed.   [DISCONTINUED] Calcium Carbonate-Vit D-Min (CALCIUM 1200 PO) Take 1 tablet by mouth daily.   [DISCONTINUED] cholecalciferol (VITAMIN D) 1000 UNITS tablet Take 1,000 Units by mouth daily.   [DISCONTINUED] predniSONE (STERAPRED UNI-PAK 21 TAB) 10 MG (21) TBPK tablet 6 day taper; take as directed instructions   No facility-administered medications prior to visit.    Review of Systems per HPI      Objective    BP 125/78 (BP Location: Left Arm, Patient Position: Sitting, Cuff Size: Normal)    Pulse 61  Temp 98.3 F (36.8 C) (Oral)    Wt 196 lb (88.9 kg)    SpO2 100%    BMI 35.85 kg/m     Physical Exam Vitals reviewed.  Constitutional:      General: She is not in acute distress.    Appearance: Normal appearance. She is well-developed. She is not diaphoretic.  HENT:     Head: Normocephalic and atraumatic.  Eyes:     General: No scleral icterus.    Conjunctiva/sclera: Conjunctivae normal.  Neck:     Thyroid: No thyromegaly.   Cardiovascular:     Rate and Rhythm: Normal rate and regular rhythm.     Pulses: Normal pulses.     Heart sounds: Normal heart sounds. No murmur heard. Pulmonary:     Effort: Pulmonary effort is normal. No respiratory distress.     Breath sounds: Normal breath sounds. No wheezing, rhonchi or rales.  Musculoskeletal:     Cervical back: Neck supple.     Right lower leg: No edema.     Left lower leg: No edema.  Lymphadenopathy:     Cervical: No cervical adenopathy.  Skin:    General: Skin is warm and dry.     Findings: No rash.  Neurological:     Mental Status: She is alert and oriented to person, place, and time. Mental status is at baseline.  Psychiatric:        Mood and Affect: Mood normal.        Behavior: Behavior normal.      No results found for any visits on 04/20/21.  Assessment & Plan     Problem List Items Addressed This Visit       Cardiovascular and Mediastinum   Essential hypertension - Primary    Well controlled Continue current medications Recheck metabolic panel F/u in 6 months       Relevant Orders   Comprehensive metabolic panel   Lipid panel     Respiratory   Sleep apnea    Using CPAP with good compliance Good improvement in symptoms        Other   Adaptation reaction    Chronic and stable Continue celexa at current dose      Class 2 severe obesity with serious comorbidity and body mass index (BMI) of 35.0 to 35.9 in adult [facility])    Discussed importance of healthy weight management Discussed diet and exercise       Other Visit Diagnoses     Hyperglycemia       Relevant Orders   Hemoglobin A1c   Colon cancer screening       Relevant Orders   Cologuard        Return in about 6 months (around 10/18/2021) for CPE, With new PCP.      Argentina Ponder [place],acting as a scribe for Lavon Paganini, MD.,have documented all relevant documentation on the behalf of Lavon Paganini, MD,as directed by  Lavon Paganini, MD while in the  presence of Lavon Paganini, MD.  I, Lavon Paganini, MD, have reviewed all documentation for this visit. The documentation on 04/20/21 for the exam, diagnosis, procedures, and orders are all accurate and complete.   Verbon Giangregorio, Dionne Bucy, MD, MPH [facility]

## 2021-04-20 ENCOUNTER — Other Ambulatory Visit: Payer: Self-pay

## 2021-04-20 ENCOUNTER — Ambulatory Visit (INDEPENDENT_AMBULATORY_CARE_PROVIDER_SITE_OTHER): Payer: 59 | Admitting: Family Medicine

## 2021-04-20 ENCOUNTER — Encounter: Payer: Self-pay | Admitting: Family Medicine

## 2021-04-20 VITALS — BP 125/78 | HR 61 | Temp 98.3°F | Wt 196.0 lb

## 2021-04-20 DIAGNOSIS — Z1211 Encounter for screening for malignant neoplasm of colon: Secondary | ICD-10-CM

## 2021-04-20 DIAGNOSIS — I10 Essential (primary) hypertension: Secondary | ICD-10-CM | POA: Diagnosis not present

## 2021-04-20 DIAGNOSIS — F4329 Adjustment disorder with other symptoms: Secondary | ICD-10-CM

## 2021-04-20 DIAGNOSIS — Z6835 Body mass index (BMI) 35.0-35.9, adult: Secondary | ICD-10-CM

## 2021-04-20 DIAGNOSIS — R739 Hyperglycemia, unspecified: Secondary | ICD-10-CM

## 2021-04-20 DIAGNOSIS — G4730 Sleep apnea, unspecified: Secondary | ICD-10-CM | POA: Diagnosis not present

## 2021-04-20 DIAGNOSIS — E66812 Obesity, class 2: Secondary | ICD-10-CM

## 2021-04-20 NOTE — Patient Instructions (Signed)
   The CDC recommends two doses of Shingrix (the shingles vaccine) separated by 2 to 6 months for adults age 56 years and older. I recommend checking with your insurance plan regarding coverage for this vaccine.   

## 2021-04-20 NOTE — Assessment & Plan Note (Signed)
Discussed importance of healthy weight management Discussed diet and exercise  

## 2021-04-20 NOTE — Assessment & Plan Note (Signed)
Well controlled Continue current medications Recheck metabolic panel F/u in 6 months  

## 2021-04-20 NOTE — Assessment & Plan Note (Signed)
Using CPAP with good compliance Good improvement in symptoms

## 2021-04-20 NOTE — Assessment & Plan Note (Signed)
Chronic and stable Continue celexa at current dose

## 2021-04-21 LAB — LIPID PANEL
Chol/HDL Ratio: 2 ratio (ref 0.0–4.4)
Cholesterol, Total: 215 mg/dL — ABNORMAL HIGH (ref 100–199)
HDL: 109 mg/dL (ref >39)
LDL Chol Calc (NIH): 97 mg/dL (ref 0–99)
Triglycerides: 50 mg/dL (ref 0–149)
VLDL Cholesterol Cal: 9 mg/dL (ref 5–40)

## 2021-04-21 LAB — COMPREHENSIVE METABOLIC PANEL
ALT: 21 IU/L (ref 0–32)
AST: 24 IU/L (ref 0–40)
Albumin/Globulin Ratio: 1.5 (ref 1.2–2.2)
Albumin: 4.4 g/dL (ref 3.8–4.9)
Alkaline Phosphatase: 70 IU/L (ref 44–121)
BUN/Creatinine Ratio: 12 (ref 9–23)
BUN: 11 mg/dL (ref 6–24)
Bilirubin Total: <0.2 mg/dL (ref 0.0–1.2)
CO2: 24 mmol/L (ref 20–29)
Calcium: 9.6 mg/dL (ref 8.7–10.2)
Chloride: 102 mmol/L (ref 96–106)
Creatinine, Ser: 0.89 mg/dL (ref 0.57–1.00)
Globulin, Total: 2.9 g/dL (ref 1.5–4.5)
Glucose: 97 mg/dL (ref 70–99)
Potassium: 4.4 mmol/L (ref 3.5–5.2)
Sodium: 140 mmol/L (ref 134–144)
Total Protein: 7.3 g/dL (ref 6.0–8.5)
eGFR: 77 mL/min/{1.73_m2} (ref >59)

## 2021-04-21 LAB — HEMOGLOBIN A1C
Est. average glucose Bld gHb Est-mCnc: 117 mg/dL
Hgb A1c MFr Bld: 5.7 % — ABNORMAL HIGH (ref 4.8–5.6)

## 2021-04-23 ENCOUNTER — Other Ambulatory Visit: Payer: Self-pay | Admitting: Family Medicine

## 2021-04-23 DIAGNOSIS — R232 Flushing: Secondary | ICD-10-CM

## 2021-05-29 ENCOUNTER — Encounter: Payer: Self-pay | Admitting: Physician Assistant

## 2021-05-31 ENCOUNTER — Other Ambulatory Visit: Payer: Self-pay | Admitting: Physician Assistant

## 2021-05-31 NOTE — Progress Notes (Signed)
Ergotamine tartrate/Belladonna Extract/Pheobarbital 0.6-0.2-'40MG'$  Capsule. ?Writing a physical rx as this is a compounded med not found in epic ?Pt been taking for years ?Called into [place]  ?

## 2021-06-08 LAB — COLOGUARD: COLOGUARD: NEGATIVE

## 2021-06-14 ENCOUNTER — Other Ambulatory Visit: Payer: Self-pay | Admitting: Physician Assistant

## 2021-06-14 ENCOUNTER — Other Ambulatory Visit: Payer: Self-pay | Admitting: Family Medicine

## 2021-06-14 DIAGNOSIS — F4329 Adjustment disorder with other symptoms: Secondary | ICD-10-CM

## 2021-06-14 MED ORDER — CITALOPRAM HYDROBROMIDE 40 MG PO TABS: 40.0000 mg | ORAL_TABLET | Freq: Every day | ORAL | 3 refills | Status: DC

## 2021-07-16 ENCOUNTER — Telehealth: Payer: Self-pay | Admitting: Physician Assistant

## 2021-07-16 NOTE — Telephone Encounter (Signed)
Medication Refill - Medication: ERGOTAMINE-PB-BELLADONNA PO ? ?Has the patient contacted their pharmacy? Yes.   ?([facility]ill  ? ?Preferred Pharmacy (with phone number or street name): [place], [place] ?Has the patient been seen for an appointment in the last year OR does the patient have an upcoming appointment? Yes.   ? ?Agent: Please be advised that RX refills may take up to 3 business days. We ask that you follow-up with your pharmacy. ? ?

## 2021-07-18 ENCOUNTER — Other Ambulatory Visit: Payer: Self-pay

## 2021-07-18 ENCOUNTER — Telehealth: Payer: Self-pay | Admitting: *Deleted

## 2021-07-18 NOTE — Telephone Encounter (Signed)
'  Megan Bass' with [facility] calling. States unsure what MD to refill med under. States received verbal from [place]. ?Ergotamine-PB-Belladonna PO ? ?Please advise: [phone] ?

## 2021-07-18 NOTE — Telephone Encounter (Signed)
[facility] about refill for ERGOTAMINE_PB_BELLADONNA PO/ please advise  ?

## 2021-07-19 ENCOUNTER — Other Ambulatory Visit: Payer: Self-pay | Admitting: Physician Assistant

## 2021-07-19 NOTE — Progress Notes (Signed)
Ergotamine tartrate/Belladonna Extract/Pheobarbital 0.6-0.2-'40MG'$  Capsule.  Called in yesterday 07/18/2021 [place], [place]

## 2021-10-17 ENCOUNTER — Telehealth: Payer: Self-pay

## 2021-10-17 NOTE — Telephone Encounter (Signed)
Copied from [place] [phone]. Topic: General - Other >> Oct 17, 2021 10:19 AM Cyndi Bender wrote: Reason for CRM: Dusty with [place] called for an update on Rx request that was faxed on 10/15/21. Cb# [phone]

## 2021-10-18 NOTE — Progress Notes (Signed)
I,[name],acting as a Education administrator for [employer], [name].,have documented all relevant documentation on the behalf of Mikey Kirschner, [name],as directed by  Mikey Kirschner, [name] while in the presence of Mikey Kirschner, [name].  Complete physical exam   Patient: Megan Bass   DOB: [birth year]   56 y.o. Female  MRN: [id number] Visit Date: 10/19/2021  Today's healthcare provider: Mikey Kirschner, [name]   No chief complaint on file.  Subjective    Megan Bass is a 56 y.o. female who presents today for a complete physical exam.  She reports consuming a  intermittent fasting and low carb  diet.  The patient reports going walking for 30 minutes daily.  She generally feels well. She reports sleeping poorly. She does have additional problems to discuss today.  HPI  -Hot flashes and night sweats, lack of sexual desire, labio is down, vaginal pain and more crying and emotional the last few weeks --thursh on tongue   -- vaginal creams  Consider buspar   Past Medical History:  Diagnosis Date  . Allergy   . Anxiety   . Depression   . Malignant neoplasm of upper-outer quadrant of female breast [facility]) October 14, 2013   Right breast excision ADH./LCIS.    Past Surgical History:  Procedure Laterality Date  . ABDOMINAL HYSTERECTOMY  02/2012  . BREAST BIOPSY Right 6.22.15   LOBULAR CARCINOMA IN SITU/ATYPICAL DUCTAL HYPERPLASIA   . BREAST BIOPSY Right 09/10/2013   fibrocystic change and pseudoangiomatous stromal  . BREAST EXCISIONAL BIOPSY Right 10/14/2013   breast ca right   . BREAST LUMPECTOMY Right 2015   LCIS  . BREAST SURGERY Right 10/14/13   lumpectomy  . ENDOMETRIAL ABLATION    . ingrown toe nail  2012  . NASAL SINUS SURGERY  11/2011  . OTHER SURGICAL HISTORY     color guard done 2018  . TUBAL LIGATION  1994   Dr.[place]   Social History   Socioeconomic History  . Marital status: Married    Spouse name: Not on file  . Number of children: Not on file  .  Years of education: Not on file  . Highest education level: Not on file  Occupational History  . Not on file  Tobacco Use  . Smoking status: Never  . Smokeless tobacco: Never  Vaping Use  . Vaping Use: Never used  Substance and Sexual Activity  . Alcohol use: Yes    Alcohol/week: 6.0 standard drinks of alcohol    Types: 6 Glasses of wine per week  . Drug use: No  . Sexual activity: Yes    Birth control/protection: Surgical  Other Topics Concern  . Not on file  Social History Narrative  . Not on file   Social Determinants of Health   Financial Resource Strain: Not on file  Food Insecurity: Not on file  Transportation Needs: Not on file  Physical Activity: Sufficiently Active (03/18/2017)   Exercise Vital Sign   . Days of Exercise per Week: 2 days   . Minutes of Exercise per Session: 90 min  Stress: No Stress Concern Present (03/18/2017)   [place]   . Feeling of Stress : Not at all  Social Connections: Moderately Integrated (03/18/2017)   Social Connection and Isolation Panel [NHANES]   . Frequency of Communication with Friends and Family: More than three times a week   . Frequency of Social Gatherings with Friends and Family: Twice a week   .  Attends Religious Services: Never   . Active Member of Clubs or Organizations: Yes   . Attends Archivist Meetings: More than 4 times per year   . Marital Status: Married  Human resources officer Violence: Not At Risk (03/18/2017)   Humiliation, Afraid, Rape, and Kick questionnaire   . Fear of Current or Ex-Partner: No   . Emotionally Abused: No   . Physically Abused: No   . Sexually Abused: No   Family Status  Relation Name Status  . Mother  (Not Specified)  . Father  (Not Specified)  . Brother  (Not Specified)  . Neg Hx  (Not Specified)   Family History  Problem Relation Age of Onset  . Hypertension Mother   . COPD Father   . Hypertension Brother   .  Cancer Neg Hx   . Breast cancer Neg Hx    Allergies  Allergen Reactions  . Iodides Itching  . Povidone Iodine   . Sulfa Antibiotics Rash    Patient Care Team: Emelia Loron as PCP - General (Physician Assistant) Will Bonnet, MD as Attending Physician (Obstetrics and Gynecology) Bary Castilla [place] Gleason, MD (General Surgery)   Medications: Outpatient Medications Prior to Visit  Medication Sig  . amLODipine (NORVASC) 5 MG tablet TAKE 1 TABLET BY MOUTH  DAILY  . cetirizine (ZYRTEC) 10 MG tablet TAKE 1 TABLET BY MOUTH EACH MORNING AS NEEDED FOR ALLERGIES  . citalopram (CELEXA) 40 MG tablet Take 1 tablet (40 mg total) by mouth daily.  . cloNIDine (CATAPRES) 0.1 MG tablet TAKE 1 TABLET BY MOUTH DAILY  . ERGOTAMINE-PB-BELLADONNA PO Take by mouth 2 (two) times daily.  Marland Kitchen triamcinolone (NASACORT) 55 MCG/ACT AERO nasal inhaler Place 2 sprays into the nose daily.   No facility-administered medications prior to visit.    Review of Systems  Constitutional:  Positive for diaphoresis.  HENT:  Positive for mouth sores.   Respiratory:  Positive for apnea.   Endocrine: Positive for cold intolerance and heat intolerance.  Genitourinary:  Positive for vaginal pain.  Allergic/Immunologic: Positive for environmental allergies.  Hematological:  Bruises/bleeds easily.  Psychiatric/Behavioral:  Positive for confusion, decreased concentration and sleep disturbance.   All other systems reviewed and are negative.   {Labs  Heme  Chem  Endocrine  Serology  Results Review (optional):23779}  Objective    There were no vitals taken for this visit. {Show previous vital signs (optional):23777}   Physical Exam  ***  Last depression screening scores    04/20/2021   11:09 AM 04/28/2018    2:19 PM 02/06/2018    9:42 AM  PHQ 2/9 Scores  PHQ - 2 Score 0 0 0  PHQ- 9 Score 1 3    Last fall risk screening    04/20/2021   11:09 AM  Fall Risk   Falls in the past year? 0   Last Audit-C  alcohol use screening    04/20/2021   11:08 AM  Alcohol Use Disorder Test (AUDIT)  1. How often do you have a drink containing alcohol? 3  2. How many drinks containing alcohol do you have on a typical day when you are drinking? 0  3. How often do you have six or more drinks on one occasion? 0  AUDIT-C Score 3   A score of 3 or more in women, and 4 or more in men indicates increased risk for alcohol abuse, EXCEPT if all of the points are from question 1   No results found for any  visits on 10/19/21.  Assessment & Plan    Routine Health Maintenance and Physical Exam  Exercise Activities and Dietary recommendations  Goals   None     Immunization History  Administered Date(s) Administered  . Influenza Inj Mdck Quad Pf 12/13/2017  . Influenza,inj,Quad PF,6+ Mos 05/24/2016, 01/14/2021  . Influenza-Unspecified 12/11/2016  . PFIZER(Purple Top)SARS-COV-2 Vaccination 05/21/2019  . Tdap 05/24/2016    Health Maintenance  Topic Date Due  . HIV Screening  Never done  . Hepatitis C Screening  Never done  . Zoster Vaccines- Shingrix (1 of 2) Never done  . COVID-19 Vaccine (2 - Pfizer risk series) 06/11/2019  . INFLUENZA VACCINE  10/02/2021  . MAMMOGRAM  01/16/2023  . Fecal DNA (Cologuard)  06/01/2024  . TETANUS/TDAP  05/25/2026  . HPV VACCINES  Aged Out  . PAP SMEAR-Modifier  Discontinued    Discussed health benefits of physical activity, and encouraged her to engage in regular exercise appropriate for her age and condition.  ***  No follow-ups on file.     {provider attestation***:1}   Mikey Kirschner, [name]  [facility] [phone] (phone) [phone] (fax)  [place]

## 2021-10-19 ENCOUNTER — Ambulatory Visit (INDEPENDENT_AMBULATORY_CARE_PROVIDER_SITE_OTHER): Payer: 59 | Admitting: Physician Assistant

## 2021-10-19 ENCOUNTER — Encounter: Payer: Self-pay | Admitting: Physician Assistant

## 2021-10-19 VITALS — BP 127/88 | HR 65 | Ht 63.0 in | Wt 171.0 lb

## 2021-10-19 DIAGNOSIS — N9419 Other specified dyspareunia: Secondary | ICD-10-CM | POA: Diagnosis not present

## 2021-10-19 DIAGNOSIS — N951 Menopausal and female climacteric states: Secondary | ICD-10-CM

## 2021-10-19 DIAGNOSIS — B370 Candidal stomatitis: Secondary | ICD-10-CM

## 2021-10-19 DIAGNOSIS — I10 Essential (primary) hypertension: Secondary | ICD-10-CM

## 2021-10-19 DIAGNOSIS — Z0000 Encounter for general adult medical examination without abnormal findings: Secondary | ICD-10-CM | POA: Diagnosis not present

## 2021-10-19 DIAGNOSIS — R41840 Attention and concentration deficit: Secondary | ICD-10-CM

## 2021-10-19 DIAGNOSIS — R232 Flushing: Secondary | ICD-10-CM | POA: Diagnosis not present

## 2021-10-19 MED ORDER — ESTRADIOL 0.1 MG/GM VA CREA: TOPICAL_CREAM | VAGINAL | 3 refills | Status: DC

## 2021-10-19 MED ORDER — FLUCONAZOLE 150 MG PO TABS: 150.0000 mg | ORAL_TABLET | Freq: Once | ORAL | 0 refills | Status: AC

## 2021-10-19 NOTE — Assessment & Plan Note (Addendum)
Managed with amlodipine 5 mg  Well controlled  Ordered cmp F/u 6 mo

## 2021-10-19 NOTE — Telephone Encounter (Signed)
Dusty called again for update on request, please advise

## 2021-10-22 ENCOUNTER — Encounter: Payer: Self-pay | Admitting: Physician Assistant

## 2021-10-22 DIAGNOSIS — R41840 Attention and concentration deficit: Secondary | ICD-10-CM | POA: Insufficient documentation

## 2021-10-22 DIAGNOSIS — B370 Candidal stomatitis: Secondary | ICD-10-CM | POA: Insufficient documentation

## 2021-10-22 DIAGNOSIS — R232 Flushing: Secondary | ICD-10-CM | POA: Insufficient documentation

## 2021-10-22 DIAGNOSIS — N9419 Other specified dyspareunia: Secondary | ICD-10-CM | POA: Insufficient documentation

## 2021-10-22 NOTE — Assessment & Plan Note (Signed)
Discussion on potentially adding buspar to her current celexa to help with increased anxiety I would like to see how she feels on just the vaginal estrogen before adding something else

## 2021-10-22 NOTE — Assessment & Plan Note (Signed)
Continue prn belladona compounded medication Will check labs

## 2021-10-22 NOTE — Assessment & Plan Note (Signed)
Discussed trying vaginal estrogen, pt does not have a uterus  Take daily x 2 weeks and then prn 2-3 times a week F/u 2 mo

## 2021-10-22 NOTE — Assessment & Plan Note (Signed)
Unusual as no meds that should cause as SE Will treat with diflucan and if no improvement consider mouthwash

## 2021-10-25 LAB — CBC WITH DIFFERENTIAL/PLATELET
Basophils Absolute: 0 10*3/uL (ref 0.0–0.2)
Basos: 0 %
EOS (ABSOLUTE): 0.1 10*3/uL (ref 0.0–0.4)
Eos: 1 %
Hematocrit: 40.4 % (ref 34.0–46.6)
Hemoglobin: 13.3 g/dL (ref 11.1–15.9)
Immature Grans (Abs): 0 10*3/uL (ref 0.0–0.1)
Immature Granulocytes: 0 %
Lymphocytes Absolute: 1.2 10*3/uL (ref 0.7–3.1)
Lymphs: 19 %
MCH: 31.1 pg (ref 26.6–33.0)
MCHC: 32.9 g/dL (ref 31.5–35.7)
MCV: 94 fL (ref 79–97)
Monocytes Absolute: 0.7 10*3/uL (ref 0.1–0.9)
Monocytes: 11 %
Neutrophils Absolute: 4.2 10*3/uL (ref 1.4–7.0)
Neutrophils: 69 %
Platelets: 290 10*3/uL (ref 150–450)
RBC: 4.28 x10E6/uL (ref 3.77–5.28)
RDW: 12.1 % (ref 11.7–15.4)
WBC: 6.1 10*3/uL (ref 3.4–10.8)

## 2021-10-25 LAB — COMPREHENSIVE METABOLIC PANEL
ALT: 17 IU/L (ref 0–32)
AST: 17 IU/L (ref 0–40)
Albumin/Globulin Ratio: 1.7 (ref 1.2–2.2)
Albumin: 4.3 g/dL (ref 3.8–4.9)
Alkaline Phosphatase: 70 IU/L (ref 44–121)
BUN/Creatinine Ratio: 15 (ref 9–23)
BUN: 15 mg/dL (ref 6–24)
Bilirubin Total: 0.2 mg/dL (ref 0.0–1.2)
CO2: 22 mmol/L (ref 20–29)
Calcium: 9.5 mg/dL (ref 8.7–10.2)
Chloride: 97 mmol/L (ref 96–106)
Creatinine, Ser: 0.97 mg/dL (ref 0.57–1.00)
Globulin, Total: 2.5 g/dL (ref 1.5–4.5)
Glucose: 105 mg/dL — ABNORMAL HIGH (ref 70–99)
Potassium: 4.3 mmol/L (ref 3.5–5.2)
Sodium: 136 mmol/L (ref 134–144)
Total Protein: 6.8 g/dL (ref 6.0–8.5)
eGFR: 69 mL/min/{1.73_m2} (ref >59)

## 2021-10-25 LAB — TSH+FREE T4
Free T4: 1 ng/dL (ref 0.82–1.77)
TSH: 3.62 u[IU]/mL (ref 0.450–4.500)

## 2021-10-25 LAB — LIPID PANEL WITH LDL/HDL RATIO
Cholesterol, Total: 203 mg/dL — ABNORMAL HIGH (ref 100–199)
HDL: 92 mg/dL (ref >39)
LDL Chol Calc (NIH): 95 mg/dL (ref 0–99)
LDL/HDL Ratio: 1 ratio (ref 0.0–3.2)
Triglycerides: 91 mg/dL (ref 0–149)
VLDL Cholesterol Cal: 16 mg/dL (ref 5–40)

## 2021-10-25 LAB — VITAMIN D 25 HYDROXY (VIT D DEFICIENCY, FRACTURES): Vit D, 25-Hydroxy: 39.9 ng/mL (ref 30.0–100.0)

## 2021-10-25 LAB — HEMOGLOBIN A1C
Est. average glucose Bld gHb Est-mCnc: 117 mg/dL
Hgb A1c MFr Bld: 5.7 % — ABNORMAL HIGH (ref 4.8–5.6)

## 2021-10-31 ENCOUNTER — Encounter: Payer: Self-pay | Admitting: Physician Assistant

## 2021-11-01 ENCOUNTER — Other Ambulatory Visit: Payer: Self-pay | Admitting: Physician Assistant

## 2021-11-01 DIAGNOSIS — B370 Candidal stomatitis: Secondary | ICD-10-CM

## 2021-11-01 MED ORDER — NYSTATIN 100000 UNIT/ML MT SUSP: OROMUCOSAL | 0 refills | Status: DC

## 2021-11-01 NOTE — Progress Notes (Signed)
Megan Bass will be back next week. Please, let pt know that she could try the mouthwash/per Lindsay's consideration/sent to her pharmacy

## 2021-11-21 ENCOUNTER — Encounter: Payer: Self-pay | Admitting: Physician Assistant

## 2021-12-21 ENCOUNTER — Encounter: Payer: Self-pay | Admitting: Physician Assistant

## 2021-12-21 ENCOUNTER — Ambulatory Visit (INDEPENDENT_AMBULATORY_CARE_PROVIDER_SITE_OTHER): Payer: 59 | Admitting: Physician Assistant

## 2021-12-21 VITALS — BP 120/84 | Ht 63.0 in | Wt 163.6 lb

## 2021-12-21 DIAGNOSIS — Z23 Encounter for immunization: Secondary | ICD-10-CM | POA: Diagnosis not present

## 2021-12-21 DIAGNOSIS — R232 Flushing: Secondary | ICD-10-CM

## 2021-12-21 DIAGNOSIS — M25552 Pain in left hip: Secondary | ICD-10-CM

## 2021-12-21 DIAGNOSIS — J302 Other seasonal allergic rhinitis: Secondary | ICD-10-CM | POA: Diagnosis not present

## 2021-12-21 DIAGNOSIS — M25551 Pain in right hip: Secondary | ICD-10-CM

## 2021-12-21 DIAGNOSIS — N9419 Other specified dyspareunia: Secondary | ICD-10-CM | POA: Diagnosis not present

## 2021-12-21 DIAGNOSIS — R41840 Attention and concentration deficit: Secondary | ICD-10-CM

## 2021-12-21 DIAGNOSIS — Z1231 Encounter for screening mammogram for malignant neoplasm of breast: Secondary | ICD-10-CM

## 2021-12-21 DIAGNOSIS — Z86000 Personal history of in-situ neoplasm of breast: Secondary | ICD-10-CM

## 2021-12-21 NOTE — Progress Notes (Signed)
I,Megan Bass,acting as a Neurosurgeon for [employer], [name].,have documented all relevant documentation on the behalf of Megan Ferguson, [name],as directed by  Megan Ferguson, [name] while in the presence of Megan Ferguson, [name].  Established patient visit   Patient: Megan Bass   DOB: 1966/02/17   56 y.o. Female  MRN: [id number] Visit Date: 12/21/2021  Today's healthcare provider: [name], [name]   Cc. Anxiety f/u, other concerns  Subjective    HPI  Pt has a few other concerns:  Hot flashes -Improved for now, still manages with belladonna, wondering if she can stop the black cohosh. Questions about new medication Veozah   Bilateral hip pain -When lying on either side for some time, will start feeling aching pain in her hips. Causes her to toss and turn at night .  Allergies -Reports increase in nasal congestion/ rhinorrhea / itchy eyes. She manages w/ zyretec.  --zaitdor montelukast   Follow up for estrogen therapy  The patient was last seen for this 8 weeks ago. Changes made at last visit include begin using vaginal creme.  She reports good compliance with treatment. She feels that condition is Improved. She is not having side effects.  Patient reports using every other night seems to be the best fit for her.   -----------------------------------------------------------------------------------------  Anxiety, Follow-up  She was last seen for anxiety 8 weeks ago. Changes made at last visit include continue current treatment.   She reports excellent compliance with treatment. She reports good tolerance of treatment. She is not having side effects.  She feels her anxiety is mild and Unchanged since last visit.  Symptoms: No chest pain Yes difficulty concentrating  No dizziness Yes fatigue  No feelings of losing control No insomnia  Yes irritable  No palpitations  No panic attacks No racing thoughts  No shortness of breath No sweating  No  tremors/shakes    GAD-7 Results    12/21/2021    3:57 PM  GAD-7 Generalized Anxiety Disorder Screening Tool  1. Feeling Nervous, Anxious, or on Edge 0  2. Not Being Able to Stop or Control Worrying 0  3. Worrying Too Much About Different Things 0  4. Trouble Relaxing 1  5. Being So Restless it's Hard To Sit Still 0  6. Becoming Easily Annoyed or Irritable 0  7. Feeling Afraid As If Something Awful Might Happen 0  Total GAD-7 Score 1  Difficulty At Work, Home, or Getting  Along With Others? Not difficult at all    PHQ-9 Scores    12/21/2021    3:57 PM 10/19/2021    3:02 PM 04/20/2021   11:09 AM  PHQ9 SCORE ONLY  PHQ-9 Total Score 1 5 1     ---------------------------------------------------------------------------------------------------   Medications: Outpatient Medications Prior to Visit  Medication Sig   amLODipine (NORVASC) 5 MG tablet TAKE 1 TABLET BY MOUTH  DAILY   cetirizine (ZYRTEC) 10 MG tablet TAKE 1 TABLET BY MOUTH EACH MORNING AS NEEDED FOR ALLERGIES   citalopram (CELEXA) 40 MG tablet Take 1 tablet (40 mg total) by mouth daily.   cloNIDine (CATAPRES) 0.1 MG tablet TAKE 1 TABLET BY MOUTH DAILY   ERGOTAMINE-PB-BELLADONNA PO Take by mouth 2 (two) times daily.   estradiol (ESTRACE VAGINAL) 0.1 MG/GM vaginal cream 500 mg daily at bedtime intravaginally for 2 weeks, then 500 mg two to three times per week   triamcinolone (NASACORT) 55 MCG/ACT AERO nasal inhaler Place 2 sprays into the nose daily.   magic mouthwash (nystatin,  lidocaine, diphenhydrAMINE, alum & mag hydroxide) suspension Swish and spit three times or as needed daily for mouth sores and thrush (Patient not taking: Reported on 12/21/2021)   No facility-administered medications prior to visit.    Review of Systems  Constitutional:  Negative for fatigue and fever.  HENT:  Positive for postnasal drip and rhinorrhea.   Respiratory:  Negative for cough and shortness of breath.   Cardiovascular:  Negative for  chest pain and leg swelling.  Gastrointestinal:  Negative for abdominal pain.  Musculoskeletal:  Positive for arthralgias and myalgias.  Neurological:  Negative for dizziness and headaches.       Objective    Blood pressure 120/84, height 5\' 3"  (1.6 m), weight 163 lb 9.6 oz (74.2 kg), SpO2 100 %.   Physical Exam Vitals reviewed.  Constitutional:      Appearance: She is not ill-appearing.  HENT:     Head: Normocephalic.  Eyes:     Conjunctiva/sclera: Conjunctivae normal.  Cardiovascular:     Rate and Rhythm: Normal rate.  Pulmonary:     Effort: Pulmonary effort is normal. No respiratory distress.  Neurological:     General: No focal deficit present.     Mental Status: She is alert and oriented to person, place, and time.  Psychiatric:        Mood and Affect: Mood normal.        Behavior: Behavior normal.     No results found for any visits on 12/21/21.  Assessment & Plan     Problem List Items Addressed This Visit       Cardiovascular and Mediastinum   Hot flashes - Primary    Improved, advised ok to d/c black cohosh I have not heard of Veozah, advised I will look into it No changes for now        Other   Dyspareunia due to medical condition in female    Resolved w/ vaginal estrogen. Pt uses every other day.  S/p hysterectomy, pt aware of small risk w/ her history of breast disease. See hx of LCIS note       Lack of concentration    Pt feeling better with just vaginal estrogen Continue celexa       Seasonal allergies    Continue zyrtec and OTC nasal sprays; discussed potentially adding montelukast. 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 Pt has regular mammograms. Discussed adding MRI for additional screening. Her Rockne Menghini 8 lifetime risk is 67.6% compared to an average 13%. History of DCIS,  LCIS, ADH.        Relevant Orders   MR BREAST BILATERAL W WO CONTRAST INC CAD   Bilateral hip pain    Pt has a bed she is able to adjust, advised trying something firmer Suggested stretches       Other Visit Diagnoses     Breast cancer screening by mammogram       Relevant Orders   MM 3D SCREEN BREAST BILATERAL   Need for immunization against influenza       Relevant Orders   Flu Vaccine QUAD 68mo+IM (Fluarix, Fluzone & Alfiuria Quad PF) (Completed)       Return in about 6 months (around 06/22/2022) for chronic conditions.      I, Megan Ferguson, [name] have reviewed  all documentation for this visit. The documentation on  12/24/2021 for the exam, diagnosis, procedures, and orders are all accurate and complete.  Megan Ferguson, [name] [facility] [address] [place], [place], [address] Office: [phone] Fax: [phone]   [facility]

## 2021-12-24 ENCOUNTER — Encounter: Payer: Self-pay | Admitting: Physician Assistant

## 2021-12-24 DIAGNOSIS — Z86000 Personal history of in-situ neoplasm of breast: Secondary | ICD-10-CM | POA: Insufficient documentation

## 2021-12-24 DIAGNOSIS — M25551 Pain in right hip: Secondary | ICD-10-CM | POA: Insufficient documentation

## 2021-12-24 DIAGNOSIS — J302 Other seasonal allergic rhinitis: Secondary | ICD-10-CM | POA: Insufficient documentation

## 2021-12-24 NOTE — Assessment & Plan Note (Signed)
Advised this gives her a high risk of breast cancer. Pt has regular mammograms. Discussed adding MRI for additional screening. Her Megan Bass 8 lifetime risk is 67.6% compared to an average 13%. History of DCIS, LCIS, ADH.

## 2021-12-24 NOTE — Assessment & Plan Note (Signed)
Improved, advised ok to d/c black cohosh I have not heard of Veozah, advised I will look into it No changes for now

## 2021-12-24 NOTE — Assessment & Plan Note (Signed)
Resolved w/ vaginal estrogen. Pt uses every other day.  S/p hysterectomy, pt aware of small risk w/ her history of breast disease. See hx of LCIS note

## 2021-12-24 NOTE — Assessment & Plan Note (Signed)
Continue zyrtec and OTC nasal sprays; discussed potentially adding montelukast. Pt will hold off for now Suggested Zatidor eye drops for allergic eye symptoms

## 2021-12-24 NOTE — Assessment & Plan Note (Signed)
Pt has a bed she is able to adjust, advised trying something firmer Suggested stretches

## 2021-12-24 NOTE — Assessment & Plan Note (Signed)
Pt feeling better with just vaginal estrogen Continue celexa

## 2022-01-28 ENCOUNTER — Ambulatory Visit
Admission: RE | Admit: 2022-01-28 | Discharge: 2022-01-28 | Disposition: A | Discharge status: Home / Self Care | Payer: 59 | Source: Ambulatory Visit | Attending: Physician Assistant | Admitting: Physician Assistant

## 2022-01-28 DIAGNOSIS — Z1231 Encounter for screening mammogram for malignant neoplasm of breast: Secondary | ICD-10-CM | POA: Diagnosis present

## 2022-02-04 ENCOUNTER — Encounter (INDEPENDENT_AMBULATORY_CARE_PROVIDER_SITE_OTHER): Payer: 59 | Admitting: Physician Assistant

## 2022-02-04 DIAGNOSIS — F4329 Adjustment disorder with other symptoms: Secondary | ICD-10-CM

## 2022-02-05 NOTE — Telephone Encounter (Signed)
Left message to return call to our office.  

## 2022-02-06 MED ORDER — BUSPIRONE HCL 5 MG PO TABS: 5.0000 mg | ORAL_TABLET | Freq: Two times a day (BID) | ORAL | 1 refills | Status: DC

## 2022-02-06 NOTE — Telephone Encounter (Signed)
Please see the MyChart message reply(ies) for my assessment and plan.    This patient gave consent for this Medical Advice Message and is aware that it may result in a bill to [employer], as well as the possibility of receiving a bill for a co-payment or deductible. They are an established patient, but are not seeking medical advice exclusively about a problem treated during an in person or video visit in the last seven days. I did not recommend an in person or video visit within seven days of my reply.    I spent a total of 15 minutes cumulative time within 7 days through [employer].  Mikey Kirschner, [name]

## 2022-02-06 NOTE — Addendum Note (Signed)
Addended by: Barnie Mort on: 02/06/2022 04:59 PM   Modules accepted: Orders

## 2022-02-15 ENCOUNTER — Ambulatory Visit: Payer: 59 | Admitting: Physician Assistant

## 2022-03-01 ENCOUNTER — Other Ambulatory Visit: Payer: Self-pay | Admitting: Physician Assistant

## 2022-03-05 ENCOUNTER — Encounter: Payer: Self-pay | Admitting: Physician Assistant

## 2022-03-06 ENCOUNTER — Other Ambulatory Visit: Payer: Self-pay | Admitting: Physician Assistant

## 2022-03-08 ENCOUNTER — Other Ambulatory Visit: Payer: Self-pay | Admitting: *Deleted

## 2022-03-08 ENCOUNTER — Telehealth: Payer: Self-pay | Admitting: Physician Assistant

## 2022-03-08 ENCOUNTER — Ambulatory Visit: Payer: Self-pay | Admitting: *Deleted

## 2022-03-08 NOTE — Telephone Encounter (Signed)
  Chief Complaint: [facility] needs an rx resent. Symptoms: N/A Frequency: N/A Pertinent Negatives: Patient denies N/A Disposition: '[]'$ ED /'[]'$ Urgent Care (no appt availability in office) / '[]'$ Appointment(In office/virtual)/ '[]'$  [facility]/ '[]'$ Home Care/ '[]'$ Refused Recommended Disposition /'[]'$  Mobile Bus/  Follow-up with PCP Additional Notes: Message sent to Mikey Kirschner, [name] to resend the requested rx.

## 2022-03-08 NOTE — Telephone Encounter (Signed)
Reason for Disposition  [1] Pharmacy calling with prescription question AND [2] triager unable to answer question  Answer Assessment - Initial Assessment Questions 1. NAME  of MEDICINE: "What medicine(s) are you calling about?"     Ergotamine-PB-Belladonna  Lauren with [facility] in [place] called in.  2. QUESTION: "What is your question?" (e.g., double dose of medicine, side effect)      The rx got cut off in the fax machine so half of it is missing.   Can another rx be sent?    Thanks.  3. PRESCRIBER: "Who prescribed the medicine?" Reason: if prescribed by specialist, call should be referred to that group.      Mikey Kirschner, [name]  4. SYMPTOMS: "Do you have any symptoms?" If Yes, ask: "What symptoms are you having?"  "How bad are the symptoms (e.g., mild, moderate, severe)     N/A 5. PREGNANCY:  "Is there any chance that you are pregnant?" "When was your last menstrual period?"     N/A  Protocols used: Medication Question Call-A-AH

## 2022-03-08 NOTE — Telephone Encounter (Signed)
Last OV : 10/19/21 - for dx Next OV: 06/21/22 Last refill: 04/05/21

## 2022-03-08 NOTE — Telephone Encounter (Signed)
[facility] faxed refill request for the following medications:   amLODipine (NORVASC) 5 MG tablet    Please advise

## 2022-03-12 ENCOUNTER — Encounter: Payer: Self-pay | Admitting: Physician Assistant

## 2022-03-12 DIAGNOSIS — I10 Essential (primary) hypertension: Secondary | ICD-10-CM

## 2022-03-12 MED ORDER — AMLODIPINE BESYLATE 5 MG PO TABS: 5.0000 mg | ORAL_TABLET | Freq: Every day | ORAL | 1 refills | Status: DC

## 2022-03-12 NOTE — Telephone Encounter (Signed)
RX was refaxed

## 2022-03-27 ENCOUNTER — Other Ambulatory Visit: Payer: Self-pay | Admitting: Physician Assistant

## 2022-03-27 DIAGNOSIS — R232 Flushing: Secondary | ICD-10-CM

## 2022-06-07 ENCOUNTER — Encounter: Payer: Self-pay | Admitting: Physician Assistant

## 2022-06-19 ENCOUNTER — Encounter: Payer: Self-pay | Admitting: Physician Assistant

## 2022-06-20 ENCOUNTER — Encounter: Payer: Self-pay | Admitting: Physician Assistant

## 2022-06-21 ENCOUNTER — Ambulatory Visit: Payer: 59 | Admitting: Physician Assistant

## 2022-06-24 ENCOUNTER — Other Ambulatory Visit: Payer: Self-pay

## 2022-06-24 NOTE — Telephone Encounter (Signed)
Patient has appt with you tomorrow. I am not sure if you wanted me to go ahead and send in the refill or if you wanted to wait.

## 2022-06-25 ENCOUNTER — Encounter: Payer: Self-pay | Admitting: Physician Assistant

## 2022-06-25 ENCOUNTER — Ambulatory Visit (INDEPENDENT_AMBULATORY_CARE_PROVIDER_SITE_OTHER): Payer: 59 | Admitting: Physician Assistant

## 2022-06-25 VITALS — BP 122/84 | HR 63

## 2022-06-25 DIAGNOSIS — R21 Rash and other nonspecific skin eruption: Secondary | ICD-10-CM | POA: Diagnosis not present

## 2022-06-25 DIAGNOSIS — I10 Essential (primary) hypertension: Secondary | ICD-10-CM | POA: Diagnosis not present

## 2022-06-25 DIAGNOSIS — F4329 Adjustment disorder with other symptoms: Secondary | ICD-10-CM

## 2022-06-25 DIAGNOSIS — Z86000 Personal history of in-situ neoplasm of breast: Secondary | ICD-10-CM | POA: Diagnosis not present

## 2022-06-25 MED ORDER — BUSPIRONE HCL 5 MG PO TABS: 5.0000 mg | ORAL_TABLET | Freq: Two times a day (BID) | ORAL | 1 refills | Status: DC

## 2022-06-25 NOTE — Assessment & Plan Note (Signed)
Candidate for high risk screening. MR ordered. Her Rockne Menghini 8 lifetime risk is 67.6% compared to an average 13%. History of DCIS, LCIS, ADH.

## 2022-06-25 NOTE — Assessment & Plan Note (Signed)
Chronic and stable Continue celexa and buspar

## 2022-06-25 NOTE — Progress Notes (Signed)
I,Megan Bass,acting as a Neurosurgeon for [employer], [name].,have documented all relevant documentation on the behalf of Megan Ferguson, [name],as directed by  Megan Ferguson, [name] while in the presence of Megan Ferguson, [name].   Established patient visit   Patient: Megan Bass   DOB: 05-Dec-1965   57 y.o. Female  MRN: [id number] Visit Date: 06/25/2022  Today's healthcare provider: [name], [name]   Cc. F/u  Subjective    HPI  Pt reports a bug bite on her left shoulder 4/10 that was itchy and painful, some tenderness in her axilla. She reports overall it has improved, using cortizone 10 twice a day, but the one bite turned into 5.   Hypertension, follow-up  BP Readings from Last 3 Encounters:  06/25/22 122/84  12/21/21 120/84  10/19/21 127/88   Wt Readings from Last 3 Encounters:  12/21/21 163 lb 9.6 oz (74.2 kg)  10/19/21 171 lb (77.6 kg)  04/20/21 196 lb (88.9 kg)     She was last seen for hypertension 8 months ago.  BP at that visit was 120/84. Management since that visit includes continue current treatment.  Outside blood pressures are 122/84  Symptoms: No chest pain No chest pressure  No palpitations No syncope  No dyspnea No orthopnea  No paroxysmal nocturnal dyspnea No lower extremity edema   Pertinent labs Lab Results  Component Value Date   CHOL 203 (H) 10/24/2021   HDL 92 10/24/2021   LDLCALC 95 10/24/2021   TRIG 91 10/24/2021   CHOLHDL 2.0 04/20/2021   Lab Results  Component Value Date   NA 136 10/24/2021   K 4.3 10/24/2021   CREATININE 0.97 10/24/2021   EGFR 69 10/24/2021   GLUCOSE 105 (H) 10/24/2021   TSH 3.620 10/24/2021     The 10-year ASCVD risk score (Arnett DK, et al., 2019) is: 1.7%  ---------------------------------------------------------------------------------------------------  Anxiety, Follow-up  She was last seen for anxiety 8 months ago. Changes made at last visit include continue current treatment.    She  reports excellent tolerance of treatment. She feels her anxiety is mild and Improved since last visit.  Symptoms: No chest pain No difficulty concentrating  No dizziness Yes fatigue  No feelings of losing control No insomnia  No irritable No palpitations  No panic attacks No racing thoughts  No shortness of breath Yes sweating (hot flashes)  No tremors/shakes    GAD-7 Results    06/25/2022    4:21 PM 12/21/2021    3:57 PM  GAD-7 Generalized Anxiety Disorder Screening Tool  1. Feeling Nervous, Anxious, or on Edge 1 0  2. Not Being Able to Stop or Control Worrying 0 0  3. Worrying Too Much About Different Things 0 0  4. Trouble Relaxing 1 1  5. Being So Restless it's Hard To Sit Still 0 0  6. Becoming Easily Annoyed or Irritable 0 0  7. Feeling Afraid As If Something Awful Might Happen 0 0  Total GAD-7 Score 2 1  Difficulty At Work, Home, or Getting  Along With Others? Not difficult at all Not difficult at all    PHQ-9 Scores    06/25/2022    4:20 PM 12/21/2021    3:57 PM 10/19/2021    3:02 PM  PHQ9 SCORE ONLY  PHQ-9 Total Score ---------------------------------------------------------------------------------------------------   Medications: Outpatient Medications Prior to Visit  Medication Sig   amLODipine (NORVASC) 5 MG tablet Take 1 tablet (5 mg total) by mouth  daily.   cetirizine (ZYRTEC) 10 MG tablet TAKE 1 TABLET BY MOUTH EACH MORNING AS NEEDED FOR ALLERGIES   citalopram (CELEXA) 40 MG tablet Take 1 tablet (40 mg total) by mouth daily.   cloNIDine (CATAPRES) 0.1 MG tablet TAKE 1 TABLET BY MOUTH DAILY   ERGOTAMINE-PB-BELLADONNA PO Take by mouth 2 (two) times daily.   estradiol (ESTRACE VAGINAL) 0.1 MG/GM vaginal cream 500 mg daily at bedtime intravaginally for 2 weeks, then 500 mg two to three times per week   triamcinolone (NASACORT) 55 MCG/ACT AERO nasal inhaler Place 2 sprays into the nose daily.   [DISCONTINUED] busPIRone (BUSPAR) 5 MG tablet TAKE 1  TABLET BY MOUTH TWICE A DAY   [DISCONTINUED] magic mouthwash (nystatin, lidocaine, diphenhydrAMINE, alum & mag hydroxide) suspension Swish and spit three times or as needed daily for mouth sores and thrush (Patient not taking: Reported on 12/21/2021)   No facility-administered medications prior to visit.   Review of Systems  Constitutional:  Negative for fatigue and fever.  Respiratory:  Negative for cough and shortness of breath.   Cardiovascular:  Negative for chest pain and leg swelling.  Gastrointestinal:  Negative for abdominal pain.  Neurological:  Negative for dizziness and headaches.      Objective    BP 122/84 Comment: home value  Pulse 63   SpO2 100%   Physical Exam Vitals reviewed.  Constitutional:      Appearance: She is not ill-appearing.  HENT:     Head: Normocephalic.  Eyes:     Conjunctiva/sclera: Conjunctivae normal.  Cardiovascular:     Rate and Rhythm: Normal rate.  Pulmonary:     Effort: Pulmonary effort is normal. No respiratory distress.  Neurological:     General: No focal deficit present.     Mental Status: She is alert and oriented to person, place, and time.  Psychiatric:        Mood and Affect: Mood normal.        Behavior: Behavior normal.     No results found for any visits on 06/25/22.  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 MR ordered. Her Rockne Menghini 8 lifetime risk is 67.6% compared to an average 13%. History of DCIS, LCIS, ADH.       Relevant Orders   MR BREAST BILATERAL W WO CONTRAST INC CAD   Other  Visit Diagnoses     Rash          4. Rash appears now like possible shingles, but now all scabbed/closed. Present for > 7 days. Continue cortisone topically   Return in about 5 months (around 11/25/2022) for CPE.      I, Megan Ferguson, [name] have reviewed all documentation for this visit. The documentation on  06/25/22 for the exam, diagnosis, procedures, and orders are all accurate and complete.  Megan Ferguson, [name] [facility] [address] [place], [place], [address] Office: [phone] Fax: [phone]   [facility]

## 2022-06-25 NOTE — Assessment & Plan Note (Signed)
Elevated in office normal at home Continue monitoring  Continue amlodipine 5 mg

## 2022-07-22 ENCOUNTER — Other Ambulatory Visit: Payer: Self-pay | Admitting: Physician Assistant

## 2022-07-22 DIAGNOSIS — F4329 Adjustment disorder with other symptoms: Secondary | ICD-10-CM

## 2022-08-05 ENCOUNTER — Other Ambulatory Visit: Payer: Self-pay | Admitting: Physician Assistant

## 2022-08-05 ENCOUNTER — Ambulatory Visit
Admission: RE | Admit: 2022-08-05 | Discharge: 2022-08-05 | Disposition: A | Discharge status: Home / Self Care | Payer: 59 | Source: Ambulatory Visit | Attending: Physician Assistant | Admitting: Physician Assistant

## 2022-08-05 DIAGNOSIS — Z86000 Personal history of in-situ neoplasm of breast: Secondary | ICD-10-CM | POA: Diagnosis present

## 2022-08-05 DIAGNOSIS — N951 Menopausal and female climacteric states: Secondary | ICD-10-CM

## 2022-08-05 DIAGNOSIS — N9419 Other specified dyspareunia: Secondary | ICD-10-CM

## 2022-08-05 MED ORDER — GADOBUTROL 1 MMOL/ML IV SOLN
7.0000 mL | Freq: Once | INTRAVENOUS | Status: AC | PRN
  Administered 2022-08-05: 7 mL via INTRAVENOUS

## 2022-08-19 ENCOUNTER — Other Ambulatory Visit: Payer: Self-pay | Admitting: Physician Assistant

## 2022-08-19 DIAGNOSIS — I10 Essential (primary) hypertension: Secondary | ICD-10-CM

## 2022-09-09 ENCOUNTER — Encounter: Payer: Self-pay | Admitting: Physician Assistant

## 2022-09-12 ENCOUNTER — Ambulatory Visit: Payer: 59 | Admitting: Physician Assistant

## 2022-09-12 ENCOUNTER — Encounter: Payer: Self-pay | Admitting: Physician Assistant

## 2022-09-12 VITALS — BP 127/86 | HR 60 | Temp 98.0°F | Ht 63.0 in | Wt 169.4 lb

## 2022-09-12 DIAGNOSIS — R232 Flushing: Secondary | ICD-10-CM

## 2022-09-12 DIAGNOSIS — J0110 Acute frontal sinusitis, unspecified: Secondary | ICD-10-CM | POA: Diagnosis not present

## 2022-09-12 MED ORDER — GABAPENTIN 300 MG PO CAPS: 300.0000 mg | ORAL_CAPSULE | Freq: Three times a day (TID) | ORAL | 3 refills | Status: DC

## 2022-09-12 MED ORDER — PREDNISONE 20 MG PO TABS: 20.0000 mg | ORAL_TABLET | Freq: Every day | ORAL | 0 refills | Status: DC

## 2022-09-12 NOTE — Progress Notes (Signed)
Complete physical exam   Patient: Megan Bass   DOB: [birth year]   57 y.o. Female  MRN: [id number] Visit Date: 09/12/2022  Today's healthcare provider: [name], [name]   Chief Complaint  Patient presents with   Sinusitis    She complains of dizziness, runny nose, hoarseness, nasal congestion in the mornings, no sense of smell, .with no fever, chills, night sweats or weight loss. Onset of symptoms was 09/02/22  and gradually worsening.She is drinking plenty of fluids.  Past history is significant for no history of pneumonia or bronchitis. Patient is non-smoker   Subjective    Discussed the use of AI scribe software for clinical note transcription with the patient, who gave verbal consent to proceed.  History of Present Illness   The patient presents with fatigue, sinus congestion.. The patient reports mowing the yard for four hours straight, which she believes triggered their current symptoms--due to allergies. This was nearly 2 weeks ago. . She has been managing their symptoms with daily antihistamines and Nasacort, but the symptoms persist. The patient also reports a clear nasal discharge that occasionally drips from their nose.    The patient has been on medication for hot flashes, which has been effective, but is no longer available. The patient reports that without the medication, the hot flashes are unbearable, disrupting her focus and concentration.      Past Medical History:  Diagnosis Date   Allergy    Anxiety    Depression    Malignant neoplasm of upper-outer quadrant of female breast [facility]) October 14, 2013   Right breast excision ADH./LCIS.    Past Surgical History:  Procedure Laterality Date   ABDOMINAL HYSTERECTOMY  02/2012   BREAST BIOPSY Right 6.22.15   LOBULAR CARCINOMA IN SITU/ATYPICAL DUCTAL HYPERPLASIA    BREAST BIOPSY Right 09/10/2013   fibrocystic change and pseudoangiomatous stromal   BREAST EXCISIONAL BIOPSY Right 10/14/2013   breast ca  right    BREAST LUMPECTOMY Right 2015   LCIS   BREAST SURGERY Right 10/14/13   lumpectomy   ENDOMETRIAL ABLATION     ingrown toe nail  2012   NASAL SINUS SURGERY  11/2011   OTHER SURGICAL HISTORY     color guard done 2018   TUBAL LIGATION  1994   Dr.[place]   Social History   Socioeconomic History   Marital status: Married    Spouse name: Not on file   Number of children: Not on file   Years of education: Not on file   Highest education level: Some college, no degree  Occupational History   Not on file  Tobacco Use   Smoking status: Never   Smokeless tobacco: Never  Vaping Use   Vaping status: Never Used  Substance and Sexual Activity   Alcohol use: Yes    Alcohol/week: 6.0 standard drinks of alcohol    Types: 6 Glasses of wine per week   Drug use: No   Sexual activity: Yes    Birth control/protection: Surgical  Other Topics Concern   Not on file  Social History Narrative   Not on file   Social Determinants of Health   Financial Resource Strain: Low Risk  (06/21/2022)   Overall Financial Resource Strain (CARDIA)    Difficulty of Paying Living Expenses: Not hard at all  Food Insecurity: No Food Insecurity (06/21/2022)   Hunger Vital Sign    Worried About Running Out of Food in the Last Year: Never true    Ran  Out of Food in the Last Year: Never true  Transportation Needs: No Transportation Needs (06/21/2022)   PRAPARE - Administrator, Civil Service (Medical): No    Lack of Transportation (Non-Medical): No  Physical Activity: Insufficiently Active (06/21/2022)   Exercise Vital Sign    Days of Exercise per Week: 3 days    Minutes of Exercise per Session: 10 min  Stress: No Stress Concern Present (06/21/2022)   [employer] of Occupational Health - Occupational Stress Questionnaire    Feeling of Stress : Only a little  Social Connections: Socially Integrated (06/21/2022)   Social Connection and Isolation Panel [NHANES]    Frequency of  Communication with Friends and Family: More than three times a week    Frequency of Social Gatherings with Friends and Family: More than three times a week    Attends Religious Services: More than 4 times per year    Active Member of Clubs or Organizations: Yes    Attends Banker Meetings: More than 4 times per year    Marital Status: Married  Catering manager Violence: Not At Risk (03/18/2017)   Humiliation, Afraid, Rape, and Kick questionnaire    Fear of Current or Ex-Partner: No    Emotionally Abused: No    Physically Abused: No    Sexually Abused: No   Family Status  Relation Name Status   Mother  (Not Specified)   Father  (Not Specified)   Brother  (Not Specified)   Neg Hx  (Not Specified)  No partnership data on file   Family History  Problem Relation Age of Onset   Hypertension Mother    COPD Father    Hypertension Brother    Cancer Neg Hx    Breast cancer Neg Hx    Allergies  Allergen Reactions   Iodides Itching   Povidone Iodine    Sulfa Antibiotics Rash    Patient Care Team: Ronnald Ramp, MD as PCP - General (Family Medicine) Conard Novak, MD as Attending Physician (Obstetrics and Gynecology) Earline Mayotte, MD (General Surgery)   Medications: Outpatient Medications Prior to Visit  Medication Sig   amLODipine (NORVASC) 5 MG tablet TAKE 1 TABLET BY MOUTH DAILY   busPIRone (BUSPAR) 5 MG tablet Take 1 tablet (5 mg total) by mouth 2 (two) times daily.   cetirizine (ZYRTEC) 10 MG tablet TAKE 1 TABLET BY MOUTH EACH MORNING AS NEEDED FOR ALLERGIES   citalopram (CELEXA) 40 MG tablet TAKE 1 TABLET BY MOUTH DAILY   cloNIDine (CATAPRES) 0.1 MG tablet TAKE 1 TABLET BY MOUTH DAILY   estradiol (ESTRACE) 0.1 MG/GM vaginal cream 500 MG DAILY AT BEDTIME INTRAVAGINALLY FOR 2 WEEKS, THEN 500 MG TWO TO THREE TIMES PER WEEK   triamcinolone (NASACORT) 55 MCG/ACT AERO nasal inhaler Place 2 sprays into the nose daily.   [DISCONTINUED]  ERGOTAMINE-PB-BELLADONNA PO Take by mouth 2 (two) times daily.   No facility-administered medications prior to visit.     Objective    BP 127/86 (BP Location: Right Arm)   Pulse 60   Temp 98 F (36.7 C) (Oral)   Ht 5\' 3"  (1.6 m)   Wt 169 lb 6.4 oz (76.8 kg)   SpO2 100%   BMI 30.01 kg/m    Physical Exam Constitutional:      General: She is awake.     Appearance: She is well-developed.  HENT:     Head: Normocephalic.     Right Ear: Tympanic membrane normal.  Left Ear: Tympanic membrane normal.     Nose: Congestion and rhinorrhea present.  Eyes:     Conjunctiva/sclera: Conjunctivae normal.  Cardiovascular:     Rate and Rhythm: Normal rate.  Pulmonary:     Effort: Pulmonary effort is normal.     Breath sounds: Normal breath sounds.  Skin:    General: Skin is warm.  Neurological:     Mental Status: She is alert and oriented to person, place, and time.  Psychiatric:        Attention and Perception: Attention normal.        Mood and Affect: Mood normal.        Speech: Speech normal.        Behavior: Behavior is cooperative.     Last depression screening scores    09/12/2022    9:14 AM 06/25/2022    4:20 PM 12/21/2021    3:57 PM  PHQ 2/9 Scores  PHQ - 2 Score 0 0 0  PHQ- 9 Score  1 1   Last fall risk screening    09/12/2022    9:13 AM  Fall Risk   Falls in the past year? 0  Injury with Fall? 0  Risk for fall due to : No Fall Risks  Follow up Falls evaluation completed   Last Audit-C alcohol use screening    06/21/2022    2:21 PM  Alcohol Use Disorder Test (AUDIT)  1. How often do you have a drink containing alcohol? 4  2. How many drinks containing alcohol do you have on a typical day when you are drinking? 0  3. How often do you have six or more drinks on one occasion? 0  AUDIT-C Score 4  4. How often during the last year have you found that you were not able to stop drinking once you had started? 0  5. How often during the last year have you failed  to do what was normally expected from you because of drinking? 0  6. How often during the last year have you needed a first drink in the morning to get yourself going after a heavy drinking session? 0  7. How often during the last year have you had a feeling of guilt of remorse after drinking? 0  8. How often during the last year have you been unable to remember what happened the night before because you had been drinking? 0  9. Have you or someone else been injured as a result of your drinking? 0  10. Has a relative or friend or a doctor or another health worker been concerned about your drinking or suggested you cut down? 0  Alcohol Use Disorder Identification Test Final Score (AUDIT) 4   A score of 3 or more in women, and 4 or more in men indicates increased risk for alcohol abuse, EXCEPT if all of the points are from question 1   No results found for any visits on 09/12/22.  Assessment & Plan       Assessment and Plan    Acute sinusitis: Viral vs allergic. -Start a short course of Prednisone to reduce inflammation. -Continue daily Cetirizine and Nasacort. -Encouraged use of saline spray or neti pot for nasal irrigation.  Menopausal Hot Flashes: Patient has been on effective treatment but medication is no longer available. Patient experiencing frequent hot flashes affecting quality of life. -Start Gabapentin, with a plan to titrate dose as tolerated. Advised to start with 300 mg qhs, titrate up to TID -  Consider Oxybutynin as a future option if Gabapentin is not effective or not tolerated.        Return if symptoms worsen or fail to improve.    I, [name], [name] have reviewed all documentation for this visit. The documentation on  09/12/22   for the exam, diagnosis, procedures, and orders are all accurate and complete.  [name], [name] [facility] [address] [place], [place], [address] Office: [phone] Fax: [phone]   [facility]

## 2022-10-23 ENCOUNTER — Encounter: Payer: Self-pay | Admitting: Family Medicine

## 2023-02-04 ENCOUNTER — Encounter: Payer: Self-pay | Admitting: Family Medicine

## 2023-02-04 NOTE — Progress Notes (Unsigned)
      Established patient visit   Patient: Megan Bass   DOB: 02/04/1966   57 y.o. Female  MRN: [id number] Visit Date: 02/06/2023  Today's healthcare provider: Ronnald Ramp, MD   No chief complaint on file.  Subjective       Discussed the use of AI scribe software for clinical note transcription with the patient, who gave verbal consent to proceed.  History of Present Illness          *** can she take veozah given breast cancer history   Past Medical History:  Diagnosis Date   Allergy    Anxiety    Depression    Malignant neoplasm of upper-outer quadrant of female breast (HCC) October 14, 2013   Right breast excision ADH./LCIS.     Medications: Outpatient Medications Prior to Visit  Medication Sig   amLODipine (NORVASC) 5 MG tablet TAKE 1 TABLET BY MOUTH DAILY   busPIRone (BUSPAR) 5 MG tablet Take 1 tablet (5 mg total) by mouth 2 (two) times daily.   cetirizine (ZYRTEC) 10 MG tablet TAKE 1 TABLET BY MOUTH EACH MORNING AS NEEDED FOR ALLERGIES   citalopram (CELEXA) 40 MG tablet TAKE 1 TABLET BY MOUTH DAILY   cloNIDine (CATAPRES) 0.1 MG tablet TAKE 1 TABLET BY MOUTH DAILY   estradiol (ESTRACE) 0.1 MG/GM vaginal cream 500 MG DAILY AT BEDTIME INTRAVAGINALLY FOR 2 WEEKS, THEN 500 MG TWO TO THREE TIMES PER WEEK   gabapentin (NEURONTIN) 300 MG capsule Take 1 capsule (300 mg total) by mouth 3 (three) times daily.   predniSONE (DELTASONE) 20 MG tablet Take 1 tablet (20 mg total) by mouth daily with breakfast.   triamcinolone (NASACORT) 55 MCG/ACT AERO nasal inhaler Place 2 sprays into the nose daily.   No facility-administered medications prior to visit.    Review of Systems  {Insert previous labs (optional):23779} {See past labs  Heme  Chem  Endocrine  Serology  Results Review (optional):1}   Objective    There were no vitals taken for this visit. {Insert last BP/Wt (optional):23777}{See vitals history (optional):1}    Physical Exam   ***  No results found for any visits on 02/06/23.  Assessment & Plan     Problem List Items Addressed This Visit   None   Assessment and Plan              No follow-ups on file.         Ronnald Ramp, MD  [facility] [phone] (phone) [phone] (fax)  [facility]

## 2023-02-06 ENCOUNTER — Ambulatory Visit (INDEPENDENT_AMBULATORY_CARE_PROVIDER_SITE_OTHER): Payer: 59 | Admitting: Family Medicine

## 2023-02-06 ENCOUNTER — Encounter: Payer: Self-pay | Admitting: Family Medicine

## 2023-02-06 ENCOUNTER — Other Ambulatory Visit: Payer: Self-pay | Admitting: Family Medicine

## 2023-02-06 VITALS — BP 130/84 | HR 71 | Resp 18 | Ht 63.0 in | Wt 176.3 lb

## 2023-02-06 DIAGNOSIS — I10 Essential (primary) hypertension: Secondary | ICD-10-CM

## 2023-02-06 DIAGNOSIS — Z23 Encounter for immunization: Secondary | ICD-10-CM | POA: Diagnosis not present

## 2023-02-06 DIAGNOSIS — R232 Flushing: Secondary | ICD-10-CM

## 2023-02-06 MED ORDER — HYDROCHLOROTHIAZIDE 25 MG PO TABS: 12.5000 mg | ORAL_TABLET | Freq: Every day | ORAL | 1 refills | Status: DC

## 2023-02-06 MED ORDER — VEOZAH 45 MG PO TABS: 45.0000 mg | ORAL_TABLET | Freq: Every day | ORAL | 3 refills | Status: DC

## 2023-02-06 NOTE — Assessment & Plan Note (Signed)
Persistent hot flashes and night sweats, significantly impacting quality of life. Previous trials of clonidine 0.1 mg daily, Celexa 40mg  and gabapentin 300 mg TID were ineffective. Given history of lobular carcinoma and atypical ductal hyperplasia, hormonal replacement therapy is contraindicated. Patient interested in non-hormonal treatment Veozah. Allyne Gee is contraindicated in patients with cirrhosis, severe renal impairment, or end-stage renal disease. Discussed importance of liver function monitoring and cost of Veozah. Recommended obtaining CMP, TSH, T4, and T3 to ensure liver and thyroid function are normal before starting Veozah. Plan to provide samples for a trial period to assess efficacy. - Order CMP, TSH, T4, and T3 - Start Veozah 45 mg once daily - Follow up in one month to assess efficacy - Provide samples for trial period

## 2023-02-06 NOTE — Patient Instructions (Signed)
VISIT SUMMARY:  Today, we discussed your ongoing issues with hot flashes, hypertension, and sinus problems. We reviewed your current medications and explored new treatment options to help manage your symptoms more effectively.  YOUR PLAN:  -HOT FLASHES: Hot flashes are sudden feelings of warmth, often intense, that can occur during menopause. Given your history of breast cancer, hormonal treatments are not suitable. We will start you on Veozah, a non-hormonal treatment, after ensuring your liver and thyroid functions are normal. We will provide samples for a trial period and follow up in one month to assess its effectiveness.  -HYPERTENSION: Hypertension, or high blood pressure, can increase the risk of heart disease and stroke. Your recent readings are above the target range. We will add hydrochlorothiazide 12.5 mg daily to your current medication and recheck your blood pressure in 2-3 weeks. If needed, we may increase the dose to 25 mg.  -GENERAL HEALTH MAINTENANCE: We will administer the influenza and Shingrix vaccines today. We also need to confirm whether you still have a cervix to determine if cervical cancer screening is necessary.  INSTRUCTIONS:  Please follow up in one month to assess the effectiveness of Veozah for your hot flashes. Recheck your blood pressure in 2-3 weeks, and we will order a basic metabolic panel (BMP) at that time. If your blood pressure remains above the target, we may increase the dose of hydrochlorothiazide.

## 2023-02-06 NOTE — Assessment & Plan Note (Signed)
Blood pressure readings elevated at work, with home readings slightly better but still above target. Current medication includes amlodipine 5 mg daily. Considering additional medication to achieve target BP <130/80 mmHg. Discussed options including increasing amlodipine, adding hydrochlorothiazide, or other antihypertensives. Patient prefers to avoid diuretics if possible but is open to trying hydrochlorothiazide. - chronic, not at goal  - Start hydrochlorothiazide 12.5 mg daily - Recheck blood pressure in 2-3 weeks - Order BMP at follow-up - Increase hydrochlorothiazide to 25 mg if BP remains above goal

## 2023-02-07 LAB — CMP14+EGFR
ALT: 19 [IU]/L (ref 0–32)
AST: 20 [IU]/L (ref 0–40)
Albumin: 4.4 g/dL (ref 3.8–4.9)
Alkaline Phosphatase: 70 [IU]/L (ref 44–121)
BUN/Creatinine Ratio: 15 (ref 9–23)
BUN: 14 mg/dL (ref 6–24)
Bilirubin Total: 0.4 mg/dL (ref 0.0–1.2)
CO2: 24 mmol/L (ref 20–29)
Calcium: 9.8 mg/dL (ref 8.7–10.2)
Chloride: 102 mmol/L (ref 96–106)
Creatinine, Ser: 0.93 mg/dL (ref 0.57–1.00)
Globulin, Total: 3.3 g/dL (ref 1.5–4.5)
Glucose: 102 mg/dL — ABNORMAL HIGH (ref 70–99)
Potassium: 4.5 mmol/L (ref 3.5–5.2)
Sodium: 140 mmol/L (ref 134–144)
Total Protein: 7.7 g/dL (ref 6.0–8.5)
eGFR: 72 mL/min/{1.73_m2} (ref >59)

## 2023-02-07 LAB — TSH+T4F+T3FREE
Free T4: 1.22 ng/dL (ref 0.82–1.77)
T3, Free: 2.6 pg/mL (ref 2.0–4.4)
TSH: 1.72 u[IU]/mL (ref 0.450–4.500)

## 2023-02-07 NOTE — Telephone Encounter (Signed)
Requested medication (s) are due for refill today -no  Requested medication (s) are on the active medication list -yes  Future visit scheduled -yes  Last refill: 02/06/23  Notes to clinic:   Pharmacy comment: Alternative Requested:THE PRESCRIBED MEDICATION IS NOT COVERED BY INSURANCE. PLEASE CONSIDER CHANGING TO ONE OF THE SUGGESTED COVERED ALTERNATIVES.   All Pharmacy Suggested Alternatives:  estradiol (ESTRACE) 0.5 MG tablet estrogens, conjugated, (PREMARIN) 0.3 MG tablet Esterified Estrogens (MENEST) 0.3 MG tablet PARoxetine (PAXIL) 10 MG tablet venlafaxine (EFFEXOR) 25 MG tablet    Requested Prescriptions  Pending Prescriptions Disp Refills   estradiol (ESTRACE) 0.5 MG tablet [Pharmacy Med Name: ESTRADIOL 0.5 MG TABLET]  0     OB/GYN:  Estrogens Passed - 02/06/2023 10:41 AM      Passed - Mammogram is up-to-date per Health Maintenance      Passed - Last BP in normal range    BP Readings from Last 1 Encounters:  02/06/23 130/84         Passed - Valid encounter within last 12 months    Recent Outpatient Visits           Yesterday Hot flashes   [facility] [place], [place], MD   4 months ago Acute non-recurrent frontal sinusitis   [place] [facility] [name], [name]   7 months ago Essential hypertension   [facility] [name], [name]   1 year ago Hot flashes   [facility] [name], [name]   1 year ago Encounter for annual health examination   [facility] [name], [name]       Future Appointments             In 1 week Simmons-Robinson, Tawanna Cooler, MD [facility]               Requested Prescriptions  Pending Prescriptions Disp Refills   estradiol (ESTRACE) 0.5 MG tablet [Pharmacy Med Name: ESTRADIOL 0.5 MG TABLET]  0     OB/GYN:  Estrogens Passed - 02/06/2023 10:41 AM       Passed - Mammogram is up-to-date per Health Maintenance      Passed - Last BP in normal range    BP Readings from Last 1 Encounters:  02/06/23 130/84         Passed - Valid encounter within last 12 months    Recent Outpatient Visits           Yesterday Hot flashes   [place] [facility] [place], [place], MD   4 months ago Acute non-recurrent frontal sinusitis   [facility] [name], [name]   7 months ago Essential hypertension   [place] [facility] [name], [name]   1 year ago Hot flashes   [facility] [name], [name]   1 year ago Encounter for annual health examination   [facility] [name], [name]       Future Appointments             In 1 week Simmons-Robinson, Tawanna Cooler, MD [facility]

## 2023-02-10 ENCOUNTER — Telehealth: Payer: Self-pay | Admitting: Family Medicine

## 2023-02-10 NOTE — Telephone Encounter (Signed)
Received a fax from covermymeds for Veozah 45mg   Key: [id number]

## 2023-02-14 NOTE — Telephone Encounter (Signed)
Received a fax from covermymeds for Veozah 45mg   Tablets is waiting for medication   Key: [id number]

## 2023-02-17 ENCOUNTER — Other Ambulatory Visit: Payer: Self-pay | Admitting: Family Medicine

## 2023-02-17 ENCOUNTER — Encounter: Payer: Self-pay | Admitting: Family Medicine

## 2023-02-19 ENCOUNTER — Encounter: Payer: Self-pay | Admitting: Family Medicine

## 2023-02-19 ENCOUNTER — Ambulatory Visit (INDEPENDENT_AMBULATORY_CARE_PROVIDER_SITE_OTHER): Payer: 59 | Admitting: Family Medicine

## 2023-02-19 VITALS — BP 133/85 | HR 68 | Ht 63.0 in | Wt 173.0 lb

## 2023-02-19 DIAGNOSIS — N959 Unspecified menopausal and perimenopausal disorder: Secondary | ICD-10-CM | POA: Diagnosis not present

## 2023-02-19 DIAGNOSIS — I10 Essential (primary) hypertension: Secondary | ICD-10-CM | POA: Diagnosis not present

## 2023-02-19 MED ORDER — VALSARTAN 40 MG PO TABS: 40.0000 mg | ORAL_TABLET | Freq: Every day | ORAL | 1 refills | Status: DC

## 2023-02-19 MED ORDER — HYDROCHLOROTHIAZIDE 25 MG PO TABS: 25.0000 mg | ORAL_TABLET | Freq: Every day | ORAL | Status: DC

## 2023-02-19 NOTE — Telephone Encounter (Signed)
Discontinued by: Ronnald Ramp, MD on 02/17/2023 15:40  Reason: Ineffective

## 2023-02-19 NOTE — Patient Instructions (Signed)
VISIT SUMMARY:  During your visit today, we discussed your current health concerns, including your fluctuating blood pressure, persistent headaches, and vasomotor symptoms such as hot flashes and night sweats. We reviewed your current medications and made some adjustments to better manage your blood pressure and address your symptoms. We also talked about the importance of stress management and self-care activities.  YOUR PLAN:  -HYPERTENSION: Hypertension, or high blood pressure, means that the force of the blood against your artery walls is too high, which can lead to health problems. Your blood pressure readings have been fluctuating, and we have adjusted your medications to better control it. You should stop taking amlodipine starting tonight, increase hydrochlorothiazide to 25 mg daily, and start taking valsartan. We will recheck your blood pressure and blood work in three weeks.  -VASOMOTOR SYMPTOMS (HOT FLASHES): Vasomotor symptoms, such as hot flashes and night sweats, are often related to hormonal changes. Clonidine has not been effective for you, and we discussed the risks and benefits of alternative treatments. For now, we will continue with your current management and reassess in a few weeks.  -GENERAL HEALTH MAINTENANCE: We discussed the importance of managing stress, as it can impact your blood pressure. Incorporating stress management techniques, such as taking breaks and going for walks, can be beneficial for your overall health.  INSTRUCTIONS:  Please stop taking amlodipine starting tonight, increase hydrochlorothiazide to 25 mg daily, and start taking valsartan as prescribed. We will recheck your blood pressure and blood work in three weeks. Your follow-up appointment is scheduled for March 12, 2023, at 3:40 PM.

## 2023-02-19 NOTE — Progress Notes (Unsigned)
Established patient visit   Patient: Megan Bass   DOB: Sep 05, 1965   57 y.o. Female  MRN: [id number] Visit Date: 02/19/2023  Today's healthcare provider: Ronnald Ramp, MD   Chief Complaint  Patient presents with   Hypertension   Subjective     HPI   Has brought readings with her, has had intermittent headache Last edited by Ashok Cordia, CMA on 02/19/2023  4:23 PM.       Discussed the use of AI scribe software for clinical note transcription with the patient, who gave verbal consent to proceed.  History of Present Illness   The patient, a 57 year old individual with a history of hypertension, presents for a follow-up visit. She is currently on amlodipine 5mg , clonidine 0.1mg  for vasomotor symptoms, and hydrochlorothiazide 25mg  for blood pressure control. The patient reports that her blood pressure has been borderline, with a reading of 133/85 today.  Over the past week, the patient has been experiencing fluctuating blood pressure readings at work, with values ranging from 126/79 to 169/104. She also reports a persistent dull headache, which has been present for a week, regardless of her blood pressure readings. The headache is not severe enough to warrant medication but is a constant presence.  In addition to hypertension, the patient is also dealing with vasomotor symptoms, including hot flashes and night sweats. She has tried various medications, including clonidine and gabapentin, without significant relief. She is currently using estradiol cream.  The patient acknowledges the need to manage stress better and plans to incorporate more self-care activities into her routine. She is scheduled to have some time off work, which she hopes will help reduce her stress levels and potentially improve her blood pressure control.         Past Medical History:  Diagnosis Date   Allergy    Anxiety    Depression    Malignant neoplasm of upper-outer quadrant  of female breast [facility]) October 14, 2013   Right breast excision ADH./LCIS.     Medications: Outpatient Medications Prior to Visit  Medication Sig   Biotin 86578 MCG TABS Take 1 tablet by mouth daily.   busPIRone (BUSPAR) 5 MG tablet Take 1 tablet (5 mg total) by mouth 2 (two) times daily.   citalopram (CELEXA) 40 MG tablet TAKE 1 TABLET BY MOUTH DAILY   cloNIDine (CATAPRES) 0.1 MG tablet TAKE 1 TABLET BY MOUTH DAILY   estradiol (ESTRACE) 0.1 MG/GM vaginal cream 500 MG DAILY AT BEDTIME INTRAVAGINALLY FOR 2 WEEKS, THEN 500 MG TWO TO THREE TIMES PER WEEK   loratadine (CLARITIN) 10 MG tablet Take 10 mg by mouth daily.   triamcinolone (NASACORT) 55 MCG/ACT AERO nasal inhaler Place 2 sprays into the nose daily.   [DISCONTINUED] amLODipine (NORVASC) 5 MG tablet TAKE 1 TABLET BY MOUTH DAILY   [DISCONTINUED] hydrochlorothiazide (HYDRODIURIL) 25 MG tablet Take 0.5 tablets (12.5 mg total) by mouth daily.   No facility-administered medications prior to visit.    Review of Systems  Last metabolic panel Lab Results  Component Value Date   GLUCOSE 102 (H) 02/06/2023   NA 140 02/06/2023   K 4.5 02/06/2023   CL 102 02/06/2023   CO2 24 02/06/2023   BUN 14 02/06/2023   CREATININE 0.93 02/06/2023   EGFR 72 02/06/2023   CALCIUM 9.8 02/06/2023   PROT 7.7 02/06/2023   ALBUMIN 4.4 02/06/2023   LABGLOB 3.3 02/06/2023   AGRATIO 1.7 10/24/2021   BILITOT 0.4 02/06/2023   ALKPHOS 70  02/06/2023   AST 20 02/06/2023   ALT 19 02/06/2023   ANIONGAP 7 02/15/2012   Last hemoglobin A1c Lab Results  Component Value Date   HGBA1C 5.7 (H) 10/24/2021   Last thyroid functions Lab Results  Component Value Date   TSH 1.720 02/06/2023     {See past labs  Heme  Chem  Endocrine  Serology  Results Review (optional):1}   Objective    BP 133/85   Pulse 68   Ht 5\' 3"  (1.6 m)   Wt 173 lb (78.5 kg)   SpO2 100%   BMI 30.65 kg/m  BP Readings from Last 3 Encounters:  02/19/23 133/85  02/06/23 130/84   09/12/22 127/86   Wt Readings from Last 3 Encounters:  02/19/23 173 lb (78.5 kg)  02/06/23 176 lb 4.8 oz (80 kg)  09/12/22 169 lb 6.4 oz (76.8 kg)    {See vitals history (optional):1}    Physical Exam  ***  No results found for any visits on 02/19/23.  Assessment & Plan     Problem List Items Addressed This Visit       Cardiovascular and Mediastinum   Essential hypertension - Primary   Relevant Medications   hydrochlorothiazide (HYDRODIURIL) 25 MG tablet   valsartan (DIOVAN) 40 MG tablet    Assessment and Plan    Hypertension Blood pressure readings at work have been elevated, with the highest being 169/104. Current medications include amlodipine 5 mg, clonidine 0.1 mg for vasomotor symptoms, and hydrochlorothiazide 25 mg. Today's reading in the office was 133/85. Reports a dull headache off and on for a week, possibly stress-related. Plan to adjust medications to better control blood pressure and assess for medication-related headaches. Discussed potential risks of medication changes, including serotonin syndrome. Prefers to focus on blood pressure management and stress reduction. - Stop amlodipine starting tonight - Increase hydrochlorothiazide to 25 mg daily - Prescribe valsartan to replace amlodipine - Recheck blood pressure and blood work in three weeks - Follow-up appointment on March 12, 2023 at 3:40 PM  Vasomotor Symptoms (Hot Flashes) Clonidine has not been effective for hot flashes and night sweats. Hesitant to change anxiety medication or start hormone therapy due to potential risks, including serotonin syndrome. Discussed alternative treatments and their risks, benefits, and potential outcomes. Prefers to wait a few weeks to see if symptoms improve before considering new treatments. - Continue current management and reassess in a few weeks - Discuss potential new treatments if symptoms persist  General Health Maintenance Discussed the importance of stress  management techniques and their impact on blood pressure control. - Encourage stress management techniques such as taking breaks and going for walks  Follow-up - Follow-up appointment on March 12, 2023 at 3:40 PM.         Return in about 3 weeks (around 03/12/2023) for HTN.         Ronnald Ramp, MD  [facility] [phone] (phone) [phone] (fax)  [facility]

## 2023-02-20 NOTE — Assessment & Plan Note (Signed)
Chronic, not responsive to prior treatments  Clonidine has not been effective for hot flashes and night sweats. Hesitant to change anxiety medication or start hormone therapy due to potential risks, including serotonin syndrome. Discussed alternative treatments and their risks, benefits, and potential outcomes. Prefers to wait a few weeks to see if symptoms improve before considering new treatments. - Continue current management and reassess in a few weeks - Discuss potential new treatments if symptoms persist

## 2023-02-20 NOTE — Assessment & Plan Note (Signed)
chronic Blood pressure readings at work have been elevated, with the highest being 169/104. Current medications include amlodipine 5 mg, clonidine 0.1 mg for vasomotor symptoms, and hydrochlorothiazide 25 mg. Today's reading in the office was 133/85. Reports a dull headache off and on for a week, possibly stress-related. Plan to adjust medications to better control blood pressure and assess for medication-related headaches. Discussed potential risks of medication changes, including serotonin syndrome. Prefers to focus on blood pressure management and stress reduction. - Stop amlodipine starting tonight - Increase hydrochlorothiazide to 25 mg daily - Prescribe valsartan to replace amlodipine - Recheck blood pressure and blood work in three weeks - Follow-up appointment on March 12, 2023 at 3:40 PM

## 2023-02-26 ENCOUNTER — Other Ambulatory Visit: Payer: Self-pay | Admitting: Physician Assistant

## 2023-02-26 DIAGNOSIS — R232 Flushing: Secondary | ICD-10-CM

## 2023-03-04 ENCOUNTER — Telehealth: Payer: Self-pay | Admitting: Family Medicine

## 2023-03-04 NOTE — Telephone Encounter (Signed)
Received fax from covermymeds for prior auth for Veozah 45 mg.  Key:  [id number]

## 2023-03-12 ENCOUNTER — Encounter: Payer: Self-pay | Admitting: Family Medicine

## 2023-03-12 ENCOUNTER — Ambulatory Visit (INDEPENDENT_AMBULATORY_CARE_PROVIDER_SITE_OTHER): Payer: 59 | Admitting: Family Medicine

## 2023-03-12 VITALS — BP 118/76 | HR 62 | Ht 63.0 in | Wt 175.0 lb

## 2023-03-12 DIAGNOSIS — I10 Essential (primary) hypertension: Secondary | ICD-10-CM | POA: Diagnosis not present

## 2023-03-12 DIAGNOSIS — R232 Flushing: Secondary | ICD-10-CM | POA: Diagnosis not present

## 2023-03-12 DIAGNOSIS — F4329 Adjustment disorder with other symptoms: Secondary | ICD-10-CM

## 2023-03-12 DIAGNOSIS — N959 Unspecified menopausal and perimenopausal disorder: Secondary | ICD-10-CM

## 2023-03-12 MED ORDER — HYDROCHLOROTHIAZIDE 25 MG PO TABS: 25.0000 mg | ORAL_TABLET | Freq: Every day | ORAL | Status: DC

## 2023-03-12 MED ORDER — CLONIDINE HCL 0.1 MG PO TABS: 0.1000 mg | ORAL_TABLET | Freq: Every day | ORAL | 3 refills | Status: DC

## 2023-03-12 MED ORDER — VALSARTAN 40 MG PO TABS: 40.0000 mg | ORAL_TABLET | Freq: Every day | ORAL | 1 refills | Status: DC

## 2023-03-12 MED ORDER — BUSPIRONE HCL 5 MG PO TABS: 5.0000 mg | ORAL_TABLET | Freq: Two times a day (BID) | ORAL | 1 refills | Status: DC

## 2023-03-12 MED ORDER — CITALOPRAM HYDROBROMIDE 40 MG PO TABS: 40.0000 mg | ORAL_TABLET | Freq: Every day | ORAL | 3 refills | Status: DC

## 2023-03-12 MED ORDER — HYDROCHLOROTHIAZIDE 25 MG PO TABS: 25.0000 mg | ORAL_TABLET | Freq: Every day | ORAL | 3 refills | Status: DC

## 2023-03-12 NOTE — Telephone Encounter (Signed)
 No PA needed Medication was ineffective, confirmed by patient during appt today 03/12/23

## 2023-03-12 NOTE — Progress Notes (Signed)
 Established patient visit   Patient: Megan Bass   DOB: [birth year]   58 y.o. Female  MRN: [id number] Visit Date: 03/12/2023  Today's healthcare provider: Rockie Agent, MD   Chief Complaint  Patient presents with   Follow-up    HTN-pt questioning regarding hydrochlorothiazide  1 or 0.5   Subjective     HPI     Follow-up    Additional comments: HTN-pt questioning regarding hydrochlorothiazide  1 or 0.5      Last edited by Deitra Therisa HERO, CMA on 03/12/2023  4:12 PM.       Discussed the use of AI scribe software for clinical note transcription with the patient, who gave verbal consent to proceed.  History of Present Illness   The patient, a 58 year old individual with a history of hypertension, breast cancer, and vasomotor symptoms, presents for a follow-up visit. The patient's blood pressure has improved, currently measuring at 126/86. She is on a regimen of hydrochlorothiazide  25mg , valsartan  40mg , and clonidine  0.1mg  daily.  The patient continues to experience vasomotor symptoms, which are complicated by her history of breast cancer, precluding the use of hormonal replacement therapy. She is also on Celexa  40mg  for an adaptation reaction.  The patient reports feeling well on the day of the visit and has been actively monitoring her blood pressure at home. She has made lifestyle changes, including taking breaks from work, increasing physical activity, and monitoring her step count. She believes these changes, in combination with her current medication regimen, have contributed to her improved blood pressure control.  The patient also reports ongoing vasomotor symptoms, particularly hot flashes, which have been a persistent issue for many years. These symptoms have been resistant to various treatments, including black cohosh, gabapentin , quinidine, Veozah , and SSRIs. The patient is considering trying ginseng as a potential remedy for these symptoms.  The  patient's other medications include risperidone, citalopram , and estradiol  cream, which she applies every other day. The estradiol  cream has reportedly helped with her focus. The patient is not currently taking amlodipine .         Past Medical History:  Diagnosis Date   Allergy    Anxiety    Depression    Malignant neoplasm of upper-outer quadrant of female breast [facility]) October 14, 2013   Right breast excision ADH./LCIS.     Medications: Outpatient Medications Prior to Visit  Medication Sig   Biotin 89999 MCG TABS Take 1 tablet by mouth daily.   estradiol  (ESTRACE ) 0.1 MG/GM vaginal cream 500 MG DAILY AT BEDTIME INTRAVAGINALLY FOR 2 WEEKS, THEN 500 MG TWO TO THREE TIMES PER WEEK   loratadine (CLARITIN) 10 MG tablet Take 10 mg by mouth daily.   triamcinolone (NASACORT) 55 MCG/ACT AERO nasal inhaler Place 2 sprays into the nose daily.   [DISCONTINUED] busPIRone  (BUSPAR ) 5 MG tablet Take 1 tablet (5 mg total) by mouth 2 (two) times daily.   [DISCONTINUED] citalopram  (CELEXA ) 40 MG tablet TAKE 1 TABLET BY MOUTH DAILY   [DISCONTINUED] cloNIDine  (CATAPRES ) 0.1 MG tablet TAKE 1 TABLET BY MOUTH DAILY   [DISCONTINUED] hydrochlorothiazide  (HYDRODIURIL ) 25 MG tablet Take 1 tablet (25 mg total) by mouth daily.   [DISCONTINUED] valsartan  (DIOVAN ) 40 MG tablet Take 1 tablet (40 mg total) by mouth daily.   No facility-administered medications prior to visit.    Review of Systems  Last metabolic panel Lab Results  Component Value Date   GLUCOSE 87 03/12/2023   NA 141 03/12/2023   K 4.3 03/12/2023  CL 100 03/12/2023   CO2 25 03/12/2023   BUN 18 03/12/2023   CREATININE 0.92 03/12/2023   EGFR 73 03/12/2023   CALCIUM 9.8 03/12/2023   PROT 7.7 02/06/2023   ALBUMIN 4.4 02/06/2023   LABGLOB 3.3 02/06/2023   AGRATIO 1.7 10/24/2021   BILITOT 0.4 02/06/2023   ALKPHOS 70 02/06/2023   AST 20 02/06/2023   ALT 19 02/06/2023   ANIONGAP 7 02/15/2012        Objective    BP 118/76 (Cuff  Size: Normal)   Pulse 62   Ht 5' 3 (1.6 m)   Wt 175 lb (79.4 kg)   SpO2 100%   BMI 31.00 kg/m  BP Readings from Last 3 Encounters:  03/12/23 118/76  02/19/23 133/85  02/06/23 130/84   Wt Readings from Last 3 Encounters:  03/12/23 175 lb (79.4 kg)  02/19/23 173 lb (78.5 kg)  02/06/23 176 lb 4.8 oz (80 kg)        Physical Exam  Physical Exam   VITALS: BP- 126/86, later measured as 118/68 CHEST: Lungs clear to auscultation CARDIOVASCULAR: Normal heart sounds       Results for orders placed or performed in visit on 03/12/23  Basic Metabolic Panel (BMET)  Result Value Ref Range   Glucose 87 70 - 99 mg/dL   BUN 18 6 - 24 mg/dL   Creatinine, Ser 9.07 0.57 - 1.00 mg/dL   eGFR 73 >40 fO/fpw/8.26   BUN/Creatinine Ratio 20 9 - 23   Sodium 141 134 - 144 mmol/L   Potassium 4.3 3.5 - 5.2 mmol/L   Chloride 100 96 - 106 mmol/L   CO2 25 20 - 29 mmol/L   Calcium 9.8 8.7 - 10.2 mg/dL    Assessment & Plan     Problem List Items Addressed This Visit       Cardiovascular and Mediastinum   Hot flashes   Relevant Medications   cloNIDine  (CATAPRES ) 0.1 MG tablet   valsartan  (DIOVAN ) 40 MG tablet   hydrochlorothiazide  (HYDRODIURIL ) 25 MG tablet   Essential hypertension - Primary   Chronic hypertension, well-controlled with a blood pressure reading of 126/86 mmHg. Currently on hydrochlorothiazide  25 mg, valsartan  40 mg, and clonidine  0.1 mg daily. Patient has been monitoring blood pressure and increasing physical activity. Discussed potential for increasing valsartan  dosage if control worsens, with a maximum dose of 360 mg.  - Continue hydrochlorothiazide  25 mg daily - Continue valsartan  40 mg daily - Continue clonidine  0.1 mg daily - Order basic metabolic panel - Monitor blood pressure regularly - Consider increasing valsartan  dosage if blood pressure control worsens - Pt plans to try blood flow supplement that has NO as an ingredient, recommended monitoring for hypotension and  reviewed symptoms of low BP      Relevant Medications   cloNIDine  (CATAPRES ) 0.1 MG tablet   valsartan  (DIOVAN ) 40 MG tablet   hydrochlorothiazide  (HYDRODIURIL ) 25 MG tablet   Other Relevant Orders   Basic Metabolic Panel (BMET) (Completed)     Other   Menopausal disorder   Persistent vasomotor symptoms (hot flashes) complicated by a history of breast cancer, precluding hormonal replacement therapy. Previous treatments including Veozah  and black cohosh were ineffective. Currently using estradiol  cream every other day. Discussed trying ginseng and considering referral to endocrinologist if symptoms persist. 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 Chronic stable - Continue citalopram  40 mg daily      Relevant Medications   busPIRone  (BUSPAR ) 5 MG tablet   citalopram  (CELEXA ) 40 MG tablet       General Health Maintenance Due for physical examination and routine health maintenance. - Schedule physical examination - Perform routine labs and screenings as part of the physical examination    Return in about 1 month (around 04/12/2023) for CPE and labs .         Rockie Agent, MD  [facility] [phone] (phone) [phone] (fax)  [facility]

## 2023-03-12 NOTE — Telephone Encounter (Signed)
 It looks like this was DC in December due to it being infeffective. Please confirm and advise if prior authorization needs to be completed.

## 2023-03-13 LAB — BASIC METABOLIC PANEL
BUN/Creatinine Ratio: 20 (ref 9–23)
BUN: 18 mg/dL (ref 6–24)
CO2: 25 mmol/L (ref 20–29)
Calcium: 9.8 mg/dL (ref 8.7–10.2)
Chloride: 100 mmol/L (ref 96–106)
Creatinine, Ser: 0.92 mg/dL (ref 0.57–1.00)
Glucose: 87 mg/dL (ref 70–99)
Potassium: 4.3 mmol/L (ref 3.5–5.2)
Sodium: 141 mmol/L (ref 134–144)
eGFR: 73 mL/min/{1.73_m2} (ref >59)

## 2023-03-13 NOTE — Assessment & Plan Note (Signed)
 Adaptation reaction managed with citalopram 40 mg daily. Chronic stable - Continue citalopram 40 mg daily

## 2023-03-13 NOTE — Assessment & Plan Note (Signed)
 Persistent vasomotor symptoms (hot flashes) complicated by a history of breast cancer, precluding hormonal replacement therapy. Previous treatments including Veozah  and black cohosh were ineffective. Currently using estradiol  cream every other day. Discussed trying ginseng and considering referral to endocrinologist if symptoms persist. - Try ginseng for vasomotor symptoms - Consider referral to endocrinologist if symptoms persist - Continue estradiol  cream every other day

## 2023-03-13 NOTE — Telephone Encounter (Signed)
 Noted.  Will not proceed with PA

## 2023-03-13 NOTE — Assessment & Plan Note (Signed)
 Chronic hypertension, well-controlled with a blood pressure reading of 126/86 mmHg. Currently on hydrochlorothiazide  25 mg, valsartan  40 mg, and clonidine  0.1 mg daily. Patient has been monitoring blood pressure and increasing physical activity. Discussed potential for increasing valsartan  dosage if control worsens, with a maximum dose of 360 mg.  - Continue hydrochlorothiazide  25 mg daily - Continue valsartan  40 mg daily - Continue clonidine  0.1 mg daily - Order basic metabolic panel - Monitor blood pressure regularly - Consider increasing valsartan  dosage if blood pressure control worsens - Pt plans to try blood flow supplement that has NO as an ingredient, recommended monitoring for hypotension and reviewed symptoms of low BP

## 2023-04-11 ENCOUNTER — Ambulatory Visit: Payer: 59 | Admitting: Family Medicine

## 2023-04-11 ENCOUNTER — Encounter: Payer: Self-pay | Admitting: Family Medicine

## 2023-04-11 VITALS — BP 130/92 | HR 62 | Ht 63.0 in | Wt 179.3 lb

## 2023-04-11 DIAGNOSIS — I10 Essential (primary) hypertension: Secondary | ICD-10-CM | POA: Diagnosis not present

## 2023-04-11 DIAGNOSIS — G4730 Sleep apnea, unspecified: Secondary | ICD-10-CM

## 2023-04-11 DIAGNOSIS — Z131 Encounter for screening for diabetes mellitus: Secondary | ICD-10-CM

## 2023-04-11 DIAGNOSIS — Z23 Encounter for immunization: Secondary | ICD-10-CM | POA: Diagnosis not present

## 2023-04-11 DIAGNOSIS — Z0000 Encounter for general adult medical examination without abnormal findings: Secondary | ICD-10-CM | POA: Insufficient documentation

## 2023-04-11 DIAGNOSIS — Z1159 Encounter for screening for other viral diseases: Secondary | ICD-10-CM

## 2023-04-11 DIAGNOSIS — Z13220 Encounter for screening for lipoid disorders: Secondary | ICD-10-CM

## 2023-04-11 DIAGNOSIS — Z0001 Encounter for general adult medical examination with abnormal findings: Secondary | ICD-10-CM | POA: Diagnosis not present

## 2023-04-11 DIAGNOSIS — Z114 Encounter for screening for human immunodeficiency virus [HIV]: Secondary | ICD-10-CM

## 2023-04-11 NOTE — Assessment & Plan Note (Signed)
 58 year old female presenting for an annual physical examination. BMI is 31.76. Blood pressure is elevated at 157/96 mmHg. Discussed the importance of regular exercise and a balanced diet. Recommended 150 minutes of exercise per week and well-balanced meals with water as the primary drink. Ordered CBC, CMP, A1c, and lipid panel. Recommended HIV and hepatitis C screening. Discussed the Shingrix  vaccine, including the two-dose schedule, common flu-like symptoms, and the benefits of preventing shingles and its complications. - Recommend 150 minutes of exercise per week - Advise well-balanced meals with water as the primary drink - Order CBC, CMP, A1c, and lipid panel - Order HIV and hepatitis C screening - Administer Shingrix  vaccine

## 2023-04-11 NOTE — Assessment & Plan Note (Signed)
 Essential hypertension with current blood pressure readings elevated at 157/96 mmHg in the office. Home readings show improvement but still elevated. Currently on clonidine  0.1 mg, hydrochlorothiazide  25 mg, and valsartan  40 mg. Discussed the option of increasing valsartan  to 80 mg if blood pressure remains elevated in one month. Patient prefers to wait another month before making changes to medication. - Recheck blood pressure - Consider increasing valsartan  to 80 mg if blood pressure remains elevated in one month - Schedule virtual follow-up in one month

## 2023-04-11 NOTE — Progress Notes (Signed)
 Complete physical exam   Patient: Megan Bass   DOB: 07-Aug-1965   58 y.o. Female  MRN: [id number] Visit Date: 04/11/2023  Today's healthcare provider: Rockie Agent, MD   Chief Complaint  Patient presents with   Annual Exam   Subjective    Megan Bass is a 58 y.o. female who presents today for a complete physical exam.   She reports consuming a general diet.    She does not have additional problems to discuss today.   Discussed the use of AI scribe software for clinical note transcription with the patient, who gave verbal consent to proceed.  History of Present Illness   Megan Bass is a 58 year old female with essential hypertension who presents for an annual physical exam.  She has essential hypertension and is currently on clonidine  0.1 mg, hydrochlorothiazide  25 mg, and valsartan  40 mg. Her blood pressure readings at home have varied, with recent measurements of 134/86 mmHg and 136/81 mmHg, although today's reading was elevated at 157/96 mmHg. She notes that her blood pressure tends to rise due to work stress and traffic.  She experiences hot flashes that have been unresponsive to treatments including Celexa  40 mg, clonidine  0.1 mg, gabapentin , and Veozah . Due to her history of breast cancer, hormone therapy is not an option. She has been using an Biochemist, Clinical Wave bracelet, which helps alleviate her hot flashes and night sweats by providing cooling relief.  Her BMI is 31.76, and she acknowledges that her physical activity is limited, walking only 30-45 minutes per week. She recently engaged in more physical activity during a church event and a bonfire, which involved carrying tools and moving around extensively.  She follows a general diet and has not adopted any specific dietary regimen like keto or vegan.         Past Medical History:  Diagnosis Date   Allergy    Anxiety    Depression    Malignant neoplasm of upper-outer quadrant of  female breast [facility]) October 14, 2013   Right breast excision ADH./LCIS.    Past Surgical History:  Procedure Laterality Date   ABDOMINAL HYSTERECTOMY  02/2012   BREAST BIOPSY Right 6.22.15   LOBULAR CARCINOMA IN SITU/ATYPICAL DUCTAL HYPERPLASIA    BREAST BIOPSY Right 09/10/2013   fibrocystic change and pseudoangiomatous stromal   BREAST EXCISIONAL BIOPSY Right 10/14/2013   breast ca right    BREAST LUMPECTOMY Right 2015   LCIS   BREAST SURGERY Right 10/14/13   lumpectomy   ENDOMETRIAL ABLATION     ingrown toe nail  2012   NASAL SINUS SURGERY  11/2011   OTHER SURGICAL HISTORY     color guard done 2018   TUBAL LIGATION  1994   Dr.[place]    Social History   Socioeconomic History   Marital status: Married    Spouse name: Not on file   Number of children: Not on file   Years of education: Not on file   Highest education level: 12th grade  Occupational History   Not on file  Tobacco Use   Smoking status: Never   Smokeless tobacco: Never  Vaping Use   Vaping status: Never Used  Substance and Sexual Activity   Alcohol use: Yes    Alcohol/week: 6.0 standard drinks of alcohol    Types: 6 Glasses of wine per week   Drug use: No   Sexual activity: Yes    Birth control/protection: Surgical  Other Topics Concern   Not  on file  Social History Narrative   Not on file   Social Drivers of Health   Financial Resource Strain: Low Risk  (02/17/2023)   Overall Financial Resource Strain (CARDIA)    Difficulty of Paying Living Expenses: Not hard at all  Food Insecurity: No Food Insecurity (02/17/2023)   Hunger Vital Sign    Worried About Running Out of Food in the Last Year: Never true    Ran Out of Food in the Last Year: Never true  Transportation Needs: No Transportation Needs (02/17/2023)   PRAPARE - Administrator, Civil Service (Medical): No    Lack of Transportation (Non-Medical): No  Physical Activity: Insufficiently Active (02/17/2023)   Exercise Vital Sign     Days of Exercise per Week: 2 days    Minutes of Exercise per Session: 20 min  Stress: No Stress Concern Present (02/17/2023)   [employer] of Occupational Health - Occupational Stress Questionnaire    Feeling of Stress : Only a little  Social Connections: Socially Integrated (02/17/2023)   Social Connection and Isolation Panel [NHANES]    Frequency of Communication with Friends and Family: More than three times a week    Frequency of Social Gatherings with Friends and Family: More than three times a week    Attends Religious Services: More than 4 times per year    Active Member of [employer] or Organizations: Yes    Attends Engineer, Structural: More than 4 times per year    Marital Status: Married  Catering Manager Violence: Not At Risk (03/18/2017)   Humiliation, Afraid, Rape, and Kick questionnaire    Fear of Current or Ex-Partner: No    Emotionally Abused: No    Physically Abused: No    Sexually Abused: No   Family Status  Relation Name Status   Mother  (Not Specified)   Father  (Not Specified)   Brother  (Not Specified)   Neg Hx  (Not Specified)  No partnership data on file   Family History  Problem Relation Age of Onset   Hypertension Mother    COPD Father    Hypertension Brother    Cancer Neg Hx    Breast cancer Neg Hx    Allergies  Allergen Reactions   Iodides Itching   Povidone Iodine    Sulfa Antibiotics Rash     Medications: Outpatient Medications Prior to Visit  Medication Sig   Biotin 89999 MCG TABS Take 1 tablet by mouth daily.   busPIRone  (BUSPAR ) 5 MG tablet Take 1 tablet (5 mg total) by mouth 2 (two) times daily.   citalopram  (CELEXA ) 40 MG tablet Take 1 tablet (40 mg total) by mouth daily.   cloNIDine  (CATAPRES ) 0.1 MG tablet Take 1 tablet (0.1 mg total) by mouth daily.   estradiol  (ESTRACE ) 0.1 MG/GM vaginal cream 500 MG DAILY AT BEDTIME INTRAVAGINALLY FOR 2 WEEKS, THEN 500 MG TWO TO THREE TIMES PER WEEK   hydrochlorothiazide   (HYDRODIURIL ) 25 MG tablet Take 1 tablet (25 mg total) by mouth daily.   loratadine (CLARITIN) 10 MG tablet Take 10 mg by mouth daily.   triamcinolone (NASACORT) 55 MCG/ACT AERO nasal inhaler Place 2 sprays into the nose daily.   valsartan  (DIOVAN ) 40 MG tablet Take 1 tablet (40 mg total) by mouth daily.   No facility-administered medications prior to visit.    Review of Systems  Last CBC Lab Results  Component Value Date   WBC 6.1 10/24/2021   HGB 13.3 10/24/2021  HCT 40.4 10/24/2021   MCV 94 10/24/2021   MCH 31.1 10/24/2021   RDW 12.1 10/24/2021   PLT 290 10/24/2021   Last metabolic panel Lab Results  Component Value Date   GLUCOSE 87 03/12/2023   NA 141 03/12/2023   K 4.3 03/12/2023   CL 100 03/12/2023   CO2 25 03/12/2023   BUN 18 03/12/2023   CREATININE 0.92 03/12/2023   EGFR 73 03/12/2023   CALCIUM 9.8 03/12/2023   PROT 7.7 02/06/2023   ALBUMIN 4.4 02/06/2023   LABGLOB 3.3 02/06/2023   AGRATIO 1.7 10/24/2021   BILITOT 0.4 02/06/2023   ALKPHOS 70 02/06/2023   AST 20 02/06/2023   ALT 19 02/06/2023   ANIONGAP 7 02/15/2012   Last lipids Lab Results  Component Value Date   CHOL 203 (H) 10/24/2021   HDL 92 10/24/2021   LDLCALC 95 10/24/2021   TRIG 91 10/24/2021   CHOLHDL 2.0 04/20/2021   Last hemoglobin A1c Lab Results  Component Value Date   HGBA1C 5.7 (H) 10/24/2021   Last thyroid functions Lab Results  Component Value Date   TSH 1.720 02/06/2023   Last vitamin D  Lab Results  Component Value Date   VD25OH 39.9 10/24/2021       Objective    BP (!) 130/92 (Cuff Size: Normal)   Pulse 62   Ht 5' 3 (1.6 m)   Wt 179 lb 4.8 oz (81.3 kg)   SpO2 100%   BMI 31.76 kg/m  BP Readings from Last 3 Encounters:  04/11/23 (!) 130/92  03/12/23 118/76  02/19/23 133/85   Wt Readings from Last 3 Encounters:  04/11/23 179 lb 4.8 oz (81.3 kg)  03/12/23 175 lb (79.4 kg)  02/19/23 173 lb (78.5 kg)        Physical Exam Vitals reviewed.   Constitutional:      General: She is not in acute distress.    Appearance: Normal appearance. She is not ill-appearing, toxic-appearing or diaphoretic.  HENT:     Head: Normocephalic and atraumatic.     Right Ear: Tympanic membrane and external ear normal. There is no impacted cerumen.     Left Ear: Tympanic membrane and external ear normal. There is no impacted cerumen.     Nose: Nose normal.     Mouth/Throat:     Pharynx: Oropharynx is clear.  Eyes:     General: No scleral icterus.    Extraocular Movements: Extraocular movements intact.     Conjunctiva/sclera: Conjunctivae normal.     Pupils: Pupils are equal, round, and reactive to light.  Cardiovascular:     Rate and Rhythm: Normal rate and regular rhythm.     Pulses: Normal pulses.     Heart sounds: Normal heart sounds. No murmur heard.    No friction rub. No gallop.  Pulmonary:     Effort: Pulmonary effort is normal. No respiratory distress.     Breath sounds: Normal breath sounds. No wheezing, rhonchi or rales.  Abdominal:     General: Bowel sounds are normal. There is no distension.     Palpations: Abdomen is soft. There is no mass.     Tenderness: There is no abdominal tenderness. There is no guarding.  Musculoskeletal:        General: No deformity.     Cervical back: Normal range of motion and neck supple. No rigidity.     Right lower leg: No edema.     Left lower leg: No edema.  Lymphadenopathy:  Cervical: No cervical adenopathy.  Skin:    General: Skin is warm.     Capillary Refill: Capillary refill takes less than 2 seconds.     Findings: No erythema or rash.  Neurological:     General: No focal deficit present.     Mental Status: She is alert and oriented to person, place, and time.     Motor: No weakness.     Gait: Gait normal.  Psychiatric:        Mood and Affect: Mood normal.        Behavior: Behavior normal.       Last depression screening scores    04/11/2023    3:37 PM 03/12/2023    4:06 PM  02/19/2023    4:21 PM  PHQ 2/9 Scores  PHQ - 2 Score 0 0 0  PHQ- 9 Score 0 1 2    Last fall risk screening    03/12/2023    4:05 PM  Fall Risk   Falls in the past year? 0  Number falls in past yr: 0  Injury with Fall? 0    Last Audit-C alcohol use screening    02/17/2023    1:37 PM  Alcohol Use Disorder Test (AUDIT)  1. How often do you have a drink containing alcohol? 4  2. How many drinks containing alcohol do you have on a typical day when you are drinking? 0  3. How often do you have six or more drinks on one occasion? 0  AUDIT-C Score 4   4. How often during the last year have you found that you were not able to stop drinking once you had started? 0  5. How often during the last year have you failed to do what was normally expected from you because of drinking? 0  6. How often during the last year have you needed a first drink in the morning to get yourself going after a heavy drinking session? 0  7. How often during the last year have you had a feeling of guilt of remorse after drinking? 0  8. How often during the last year have you been unable to remember what happened the night before because you had been drinking? 0  9. Have you or someone else been injured as a result of your drinking? 0  10. Has a relative or friend or a doctor or another health worker been concerned about your drinking or suggested you cut down? 0  Alcohol Use Disorder Identification Test Final Score (AUDIT) 4      Patient-reported   A score of 3 or more in women, and 4 or more in men indicates increased risk for alcohol abuse, EXCEPT if all of the points are from question 1   No results found for any visits on 04/11/23.  Assessment & Plan    Routine Health Maintenance and Physical Exam  Immunization History  Administered Date(s) Administered   Influenza Inj Mdck Quad Pf 12/13/2017   Influenza, Seasonal, Injecte, Preservative Fre 02/06/2023   Influenza,inj,Quad PF,6+ Mos 05/24/2016,  01/14/2021, 12/21/2021   Influenza-Unspecified 12/11/2016   PFIZER(Purple Top)SARS-COV-2 Vaccination 05/21/2019, 09/22/2019, 02/17/2020   Tdap 05/24/2016   Zoster Recombinant(Shingrix ) 04/11/2023    Health Maintenance  Topic Date Due   HIV Screening  Never done   Hepatitis C Screening  Never done   COVID-19 Vaccine (4 - 2024-25 season) 04/27/2023 (Originally 11/03/2022)   Zoster Vaccines- Shingrix  (2 of 2) 06/06/2023   MAMMOGRAM  08/05/2023   Fecal  DNA (Cologuard)  06/01/2024   DTaP/Tdap/Td (2 - Td or Tdap) 05/25/2026   INFLUENZA VACCINE  Completed   HPV VACCINES  Aged Out    Problem List Items Addressed This Visit       Cardiovascular and Mediastinum   Essential hypertension   Essential hypertension with current blood pressure readings elevated at 157/96 mmHg in the office. Home readings show improvement but still elevated. Currently on clonidine  0.1 mg, hydrochlorothiazide  25 mg, and valsartan  40 mg. Discussed the option of increasing valsartan  to 80 mg if blood pressure remains elevated in one month. Patient prefers to wait another month before making changes to medication. - Recheck blood pressure - Consider increasing valsartan  to 80 mg if blood pressure remains elevated in one month - Schedule virtual follow-up in one month        Respiratory   Sleep apnea   Relevant Orders   CBC     Other   Annual physical exam - Primary   58 year old female presenting for an annual physical examination. BMI is 31.76. Blood pressure is elevated at 157/96 mmHg. Discussed the importance of regular exercise and a balanced diet. Recommended 150 minutes of exercise per week and well-balanced meals with water as the primary drink. Ordered CBC, CMP, A1c, and lipid panel. Recommended HIV and hepatitis C screening. Discussed the Shingrix  vaccine, including the two-dose schedule, common flu-like symptoms, and the benefits of preventing shingles and its complications. - Recommend 150 minutes of  exercise per week - Advise well-balanced meals with water as the primary drink - Order CBC, CMP, A1c, and lipid panel - Order HIV and hepatitis C screening - Administer Shingrix  vaccine      Other Visit Diagnoses       Screening for diabetes mellitus       Relevant Orders   Hemoglobin A1c     Screening for lipid disorders       Relevant Orders   Lipid panel     Encounter for hepatitis C screening test for low risk patient       Relevant Orders   Hepatitis C antibody     Screening for HIV (human immunodeficiency virus)       Relevant Orders   HIV Antibody (routine testing w rflx)             Hot Flashes Persistent hot flashes unresponsive to Celexa  40 mg, clonidine  0.1 mg, gabapentin , or Veozah . Patient is using an Ember Wave device for symptomatic relief. Discussed the device's functionality and its benefits in managing hot flashes and night sweats. - Continue using Ember Wave device for hot flash management   General Health Maintenance None - Recommend updated COVID vaccine (declined by patient) - Removed cervical cancer screening due to hysterectomy      Return in about 1 month (around 05/09/2023) for HTN.       Rockie Agent, MD  [facility] [phone] (phone) [phone] (fax)  [facility]

## 2023-04-12 LAB — LIPID PANEL
Chol/HDL Ratio: 1.8 {ratio} (ref 0.0–4.4)
Cholesterol, Total: 205 mg/dL — ABNORMAL HIGH (ref 100–199)
HDL: 113 mg/dL (ref >39)
LDL Chol Calc (NIH): 82 mg/dL (ref 0–99)
Triglycerides: 51 mg/dL (ref 0–149)
VLDL Cholesterol Cal: 10 mg/dL (ref 5–40)

## 2023-04-12 LAB — CBC
Hematocrit: 41.1 % (ref 34.0–46.6)
Hemoglobin: 13.7 g/dL (ref 11.1–15.9)
MCH: 31.4 pg (ref 26.6–33.0)
MCHC: 33.3 g/dL (ref 31.5–35.7)
MCV: 94 fL (ref 79–97)
Platelets: 327 10*3/uL (ref 150–450)
RBC: 4.37 x10E6/uL (ref 3.77–5.28)
RDW: 12.3 % (ref 11.7–15.4)
WBC: 6.8 10*3/uL (ref 3.4–10.8)

## 2023-04-12 LAB — HEMOGLOBIN A1C
Est. average glucose Bld gHb Est-mCnc: 120 mg/dL
Hgb A1c MFr Bld: 5.8 % — ABNORMAL HIGH (ref 4.8–5.6)

## 2023-04-12 LAB — HEPATITIS C ANTIBODY: Hep C Virus Ab: NONREACTIVE

## 2023-04-12 LAB — HIV ANTIBODY (ROUTINE TESTING W REFLEX): HIV Screen 4th Generation wRfx: NONREACTIVE

## 2023-04-14 ENCOUNTER — Encounter: Payer: Self-pay | Admitting: Family Medicine

## 2023-05-03 ENCOUNTER — Other Ambulatory Visit: Payer: Self-pay | Admitting: Family Medicine

## 2023-05-03 DIAGNOSIS — I10 Essential (primary) hypertension: Secondary | ICD-10-CM

## 2023-05-05 NOTE — Addendum Note (Signed)
 Addended by: Josph Macho E on: 05/05/2023 02:35 PM   Modules accepted: Orders

## 2023-05-05 NOTE — Telephone Encounter (Signed)
 Per note in chart- dose is hydrochlorothiazide 25 mg. Please update on Med list. Please see 04/11/23. Needs new order. Went back into chart and since was refilled wrong. Accidentally dc'd the order.  Requested Prescriptions  Signed Prescriptions Disp Refills   hydrochlorothiazide (HYDRODIURIL) 25 MG tablet 90 tablet 2    Sig: TAKE 1/2 TABLET BY MOUTH EVERY DAY     Cardiovascular: Diuretics - Thiazide Failed - 05/05/2023  2:35 PM      Failed - Last BP in normal range    BP Readings from Last 1 Encounters:  04/11/23 (!) 130/92         Passed - Cr in normal range and within 180 days    Creatinine  Date Value Ref Range Status  02/15/2012 0.89 0.60 - 1.30 mg/dL Final   Creatinine, Ser  Date Value Ref Range Status  03/12/2023 0.92 0.57 - 1.00 mg/dL Final         Passed - K in normal range and within 180 days    Potassium  Date Value Ref Range Status  03/12/2023 4.3 3.5 - 5.2 mmol/L Final  02/15/2012 3.8 3.5 - 5.1 mmol/L Final         Passed - Na in normal range and within 180 days    Sodium  Date Value Ref Range Status  03/12/2023 141 134 - 144 mmol/L Final  02/15/2012 141 136 - 145 mmol/L Final         Passed - Valid encounter within last 6 months    Recent Outpatient Visits           1 month ago Essential hypertension   [place] [facility] Simmons-Robinson, Tawanna Cooler, MD   2 months ago Essential hypertension   [place] [facility] [place], [place], MD   2 months ago Hot flashes   [place] [facility] [place], [place], MD   7 months ago Acute non-recurrent frontal sinusitis   [facility] [name], [name]   10 months ago Essential hypertension   [place] [facility] [name], [name]       Future Appointments             In 4 days Simmons-Robinson, Tawanna Cooler, MD [facility]

## 2023-05-05 NOTE — Telephone Encounter (Addendum)
 Requested Prescriptions  Pending Prescriptions Disp Refills   hydrochlorothiazide (HYDRODIURIL) 25 MG tablet [Pharmacy Med Name: HYDROCHLOROTHIAZIDE 25 MG TAB] 90 tablet 2    Sig: TAKE 1/2 TABLET BY MOUTH EVERY DAY     Cardiovascular: Diuretics - Thiazide Failed - 05/05/2023  2:26 PM      Failed - Last BP in normal range    BP Readings from Last 1 Encounters:  04/11/23 (!) 130/92         Passed - Cr in normal range and within 180 days    Creatinine  Date Value Ref Range Status  02/15/2012 0.89 0.60 - 1.30 mg/dL Final   Creatinine, Ser  Date Value Ref Range Status  03/12/2023 0.92 0.57 - 1.00 mg/dL Final         Passed - K in normal range and within 180 days    Potassium  Date Value Ref Range Status  03/12/2023 4.3 3.5 - 5.2 mmol/L Final  02/15/2012 3.8 3.5 - 5.1 mmol/L Final         Passed - Na in normal range and within 180 days    Sodium  Date Value Ref Range Status  03/12/2023 141 134 - 144 mmol/L Final  02/15/2012 141 136 - 145 mmol/L Final         Passed - Valid encounter within last 6 months    Recent Outpatient Visits           1 month ago Essential hypertension   [place] [facility] Simmons-Robinson, [place], MD   2 months ago Essential hypertension   [place] [facility] Walker, [place], MD   2 months ago Hot flashes   [place] [facility] [place], [place], MD   7 months ago Acute non-recurrent frontal sinusitis   [facility] [name], [name]   10 months ago Essential hypertension   [place] [facility] [name], [name]       Future Appointments             In 4 days Simmons-Robinson, Tawanna Cooler, MD [facility]

## 2023-05-09 ENCOUNTER — Telehealth: Payer: 59 | Admitting: Family Medicine

## 2023-05-09 ENCOUNTER — Encounter: Payer: Self-pay | Admitting: Family Medicine

## 2023-05-09 VITALS — BP 127/85 | HR 72

## 2023-05-09 DIAGNOSIS — I10 Essential (primary) hypertension: Secondary | ICD-10-CM

## 2023-05-09 DIAGNOSIS — N959 Unspecified menopausal and perimenopausal disorder: Secondary | ICD-10-CM

## 2023-05-09 NOTE — Assessment & Plan Note (Signed)
 Chronic  Hypertension is well-controlled with current medication regimen. Blood pressure readings are consistently within target range (e.g., 127/85 mmHg). Current medications include valsartan 40 mg and clonidine, which is also used for vasomotor symptoms related to menopause. She is committed to lifestyle modifications to further improve blood pressure control. - Continue valsartan 40 mg daily. - Continue clonidine as needed for vasomotor symptoms. - Encourage lifestyle modifications including increased physical activity and improved diet.

## 2023-05-09 NOTE — Progress Notes (Signed)
 MyChart Video Visit    Virtual Visit via Video Note   This format is felt to be most appropriate for this patient at this time. Physical exam was limited by quality of the video and audio technology used for the visit.   Patient location: Patient's home address   Provider location: [facility]  [address], [address]  [place], [place] [address]   I discussed the limitations of evaluation and management by telemedicine and the availability of in person appointments. The patient expressed understanding and agreed to proceed.  Patient: Megan Bass   DOB: 1965/10/31   58 y.o. Female  MRN: [id number] Visit Date: 05/09/2023  Today's healthcare provider: Ronnald Ramp, MD   No chief complaint on file.  Subjective    HPI   Discussed the use of AI scribe software for clinical note transcription with the patient, who gave verbal consent to proceed.  History of Present Illness   The patient is a 58 year old with hypertension who presents for follow-up.  She has hypertension and her blood pressure reading this morning was 127/85 mmHg with a pulse of 72 bpm. She is currently taking valsartan 40 mg and clonidine, the latter primarily for vasomotor symptoms associated with menopause. She is working on increasing her physical activity and improving her diet to further manage her blood pressure.  She experiences hot flashes and night sweats, which she manages using an [place] Wave device. While the device helps with hot flashes, it does not significantly impact her night sweats. She has decided to give up wine for [place] to see if it affects her night sweats and plans to reduce carbohydrate intake if needed. She has tried black cohosh in the past and is currently on her second month of taking ginseng with ginkgo biloba for energy and focus, which she feels has been beneficial.  She is actively engaging in lifestyle modifications to improve her overall health,  including increasing physical activity and improving her diet.          Past Medical History:  Diagnosis Date   Allergy    Anxiety    Depression    Malignant neoplasm of upper-outer quadrant of female breast [facility]) October 14, 2013   Right breast excision ADH./LCIS.     Medications: Outpatient Medications Prior to Visit  Medication Sig   Biotin 19147 MCG TABS Take 1 tablet by mouth daily.   busPIRone (BUSPAR) 5 MG tablet Take 1 tablet (5 mg total) by mouth 2 (two) times daily.   citalopram (CELEXA) 40 MG tablet Take 1 tablet (40 mg total) by mouth daily.   cloNIDine (CATAPRES) 0.1 MG tablet Take 1 tablet (0.1 mg total) by mouth daily.   estradiol (ESTRACE) 0.1 MG/GM vaginal cream 500 MG DAILY AT BEDTIME INTRAVAGINALLY FOR 2 WEEKS, THEN 500 MG TWO TO THREE TIMES PER WEEK   loratadine (CLARITIN) 10 MG tablet Take 10 mg by mouth daily.   triamcinolone (NASACORT) 55 MCG/ACT AERO nasal inhaler Place 2 sprays into the nose daily.   valsartan (DIOVAN) 40 MG tablet Take 1 tablet (40 mg total) by mouth daily.   No facility-administered medications prior to visit.    Review of Systems  Last metabolic panel Lab Results  Component Value Date   GLUCOSE 87 03/12/2023   NA 141 03/12/2023   K 4.3 03/12/2023   CL 100 03/12/2023   CO2 25 03/12/2023   BUN 18 03/12/2023   CREATININE 0.92 03/12/2023   EGFR 73  03/12/2023   CALCIUM 9.8 03/12/2023   PROT 7.7 02/06/2023   ALBUMIN 4.4 02/06/2023   LABGLOB 3.3 02/06/2023   AGRATIO 1.7 10/24/2021   BILITOT 0.4 02/06/2023   ALKPHOS 70 02/06/2023   AST 20 02/06/2023   ALT 19 02/06/2023   ANIONGAP 7 02/15/2012   Last hemoglobin A1c Lab Results  Component Value Date   HGBA1C 5.8 (H) 04/11/2023   Last thyroid functions Lab Results  Component Value Date   TSH 1.720 02/06/2023        Objective    BP 127/85 (Cuff Size: Normal)   Pulse 72   BP Readings from Last 3 Encounters:  05/09/23 127/85  04/11/23 (!) 130/92  03/12/23 118/76    Wt Readings from Last 3 Encounters:  04/11/23 179 lb 4.8 oz (81.3 kg)  03/12/23 175 lb (79.4 kg)  02/19/23 173 lb (78.5 kg)        Physical Exam Constitutional:      General: She is not in acute distress.    Appearance: Normal appearance. She is not ill-appearing.  Pulmonary:     Effort: Pulmonary effort is normal. No respiratory distress.  Neurological:     Mental Status: She is alert and oriented to person, place, and time.        Assessment & Plan     Problem List Items Addressed This Visit       Cardiovascular and Mediastinum   Essential hypertension - Primary   Chronic  Hypertension is well-controlled with current medication regimen. Blood pressure readings are consistently within target range (e.g., 127/85 mmHg). Current medications include valsartan 40 mg and clonidine, which is also used for vasomotor symptoms related to menopause. She is committed to lifestyle modifications to further improve blood pressure control. - Continue valsartan 40 mg daily. - Continue clonidine as needed for vasomotor symptoms. - Encourage lifestyle modifications including increased physical activity and improved diet.        Other   Menopausal disorder   She experiences hot flashes and night sweats. Current management includes clonidine and the use of an [place] Wave device for symptomatic relief. She is exploring lifestyle changes such as abstaining from wine and reducing carbohydrate intake to assess impact on symptoms. She is open to trying new therapies if available, but acknowledges variability in symptom duration among women. - Continue using E[place] Wave device for symptomatic relief. - Monitor the impact of lifestyle changes, including abstaining from wine and reducing carbohydrate intake, on vasomotor symptoms. - Consider further lifestyle modifications if symptoms persist.       General Health Maintenance She is actively engaged in managing her health through lifestyle  modifications and exploring alternative therapies for energy and focus. She is currently taking ginseng and ginkgo biloba supplements to improve energy and focus, reporting some improvement in focus. - Encourage continued physical activity and healthy eating habits. - Monitor the effects of ginseng and ginkgo biloba supplements on energy and focus.         No follow-ups on file.     I discussed the assessment and treatment plan with the patient. The patient was provided an opportunity to ask questions and all were answered. The patient agreed with the plan and demonstrated an understanding of the instructions.   The patient was advised to call back or seek an in-person evaluation if the symptoms worsen or if the condition fails to improve as anticipated.  I provided 16 minutes of non-face-to-face time during this encounter.   Ronnald Ramp, MD  [facility] [phone] (phone) [phone] (fax)  [facility]

## 2023-05-09 NOTE — Assessment & Plan Note (Signed)
 She experiences hot flashes and night sweats. Current management includes clonidine and the use of an Ember Wave device for symptomatic relief. She is exploring lifestyle changes such as abstaining from wine and reducing carbohydrate intake to assess impact on symptoms. She is open to trying new therapies if available, but acknowledges variability in symptom duration among women. - Continue using E[place] Wave device for symptomatic relief. - Monitor the impact of lifestyle changes, including abstaining from wine and reducing carbohydrate intake, on vasomotor symptoms. - Consider further lifestyle modifications if symptoms persist.

## 2023-06-09 ENCOUNTER — Other Ambulatory Visit: Payer: Self-pay | Admitting: Family Medicine

## 2023-06-09 DIAGNOSIS — F4329 Adjustment disorder with other symptoms: Secondary | ICD-10-CM

## 2023-06-10 NOTE — Telephone Encounter (Signed)
 Rx was sent 03/12/23 #180/1  Requested Prescriptions  Refused Prescriptions Disp Refills   busPIRone (BUSPAR) 5 MG tablet [Pharmacy Med Name: BUSPIRONE TAB 5MG ] 180 tablet 1    Sig: TAKE 1 TABLET TWICE A DAY     Psychiatry: Anxiolytics/Hypnotics - Non-controlled Passed - 06/10/2023  2:54 PM      Passed - Valid encounter within last 12 months    Recent Outpatient Visits           1 month ago Essential hypertension   [facility] Simmons-Robinson, Tawanna Cooler, MD   2 months ago Annual physical exam   [place] [facility] [place], Tawanna Cooler, MD

## 2023-06-11 ENCOUNTER — Ambulatory Visit (INDEPENDENT_AMBULATORY_CARE_PROVIDER_SITE_OTHER)

## 2023-06-11 DIAGNOSIS — Z23 Encounter for immunization: Secondary | ICD-10-CM | POA: Diagnosis not present

## 2023-06-13 ENCOUNTER — Encounter: Payer: Self-pay | Admitting: Family Medicine

## 2023-06-13 NOTE — Progress Notes (Signed)
 Pt received Shingrix vaccine. No MD evaluation was needed today.

## 2023-07-10 ENCOUNTER — Encounter: Payer: Self-pay | Admitting: Family Medicine

## 2023-07-10 NOTE — Telephone Encounter (Signed)
 Please advise if you would like this pt to come into clinic

## 2023-08-14 ENCOUNTER — Other Ambulatory Visit: Payer: Self-pay | Admitting: Family Medicine

## 2023-08-14 DIAGNOSIS — Z1231 Encounter for screening mammogram for malignant neoplasm of breast: Secondary | ICD-10-CM

## 2023-09-02 ENCOUNTER — Ambulatory Visit
Admission: RE | Admit: 2023-09-02 | Discharge: 2023-09-02 | Disposition: A | Discharge status: Home / Self Care | Source: Ambulatory Visit | Attending: Family Medicine | Admitting: Family Medicine

## 2023-09-02 DIAGNOSIS — Z1231 Encounter for screening mammogram for malignant neoplasm of breast: Secondary | ICD-10-CM | POA: Insufficient documentation

## 2023-09-04 ENCOUNTER — Ambulatory Visit: Payer: Self-pay | Admitting: Family Medicine

## 2023-09-27 ENCOUNTER — Other Ambulatory Visit: Payer: Self-pay | Admitting: Physician Assistant

## 2023-09-27 DIAGNOSIS — N9419 Other specified dyspareunia: Secondary | ICD-10-CM

## 2023-09-27 DIAGNOSIS — N951 Menopausal and female climacteric states: Secondary | ICD-10-CM

## 2023-10-05 ENCOUNTER — Ambulatory Visit: Admission: EM | Admit: 2023-10-05 | Discharge: 2023-10-05 | Disposition: A | Discharge status: Home / Self Care

## 2023-10-05 DIAGNOSIS — R21 Rash and other nonspecific skin eruption: Secondary | ICD-10-CM | POA: Diagnosis not present

## 2023-10-05 DIAGNOSIS — T63421A Toxic effect of venom of ants, accidental (unintentional), initial encounter: Secondary | ICD-10-CM

## 2023-10-05 MED ORDER — METHYLPREDNISOLONE ACETATE 80 MG/ML IJ SUSP
60.0000 mg | Freq: Once | INTRAMUSCULAR | Status: AC
  Administered 2023-10-05: 60 mg via INTRAMUSCULAR

## 2023-10-05 MED ORDER — PREDNISONE 10 MG (21) PO TBPK
ORAL_TABLET | Freq: Every day | ORAL | 0 refills | Status: AC
Start: 2023-10-05 — End: ?

## 2023-10-05 MED ORDER — HYDROXYZINE HCL 25 MG PO TABS: 25.0000 mg | ORAL_TABLET | Freq: Four times a day (QID) | ORAL | 0 refills | Status: AC

## 2023-10-05 NOTE — ED Provider Notes (Signed)
 Megan Bass    CSN: [id number] Arrival date & time: 10/05/23  1238      History   Chief Complaint Chief Complaint  Patient presents with   Insect Bite    HPI Megan Bass is a 58 y.o. female.   Patient presents for evaluation of a blistering rash present to the right hand and wrist beginning 1 day ago after being bitten by fire ants. endorses severe pruritus, making it difficult to focus on any other activity and interfering with sleep.  Has been using oral and topical Benadryl with minimal improvement.  Denies drainage or fever.  Past Medical History:  Diagnosis Date   Allergy    Anxiety    Depression    Malignant neoplasm of upper-outer quadrant of female breast [facility]) October 14, 2013   Right breast excision ADH./LCIS.     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   Intractable migraine with aura without status migrainosus 09/27/2016   Arthritis 01/18/2016   Carcinoma in situ, breast, 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   Adaptation reaction 09/04/2015   Vaginal atrophy 08/22/2015   Menopausal disorder 08/16/2014   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   Breast neoplasm, Tis (LCIS) 09/02/2013    Past Surgical History:  Procedure Laterality Date   ABDOMINAL HYSTERECTOMY  02/2012   BREAST BIOPSY Right 6.22.15   LOBULAR CARCINOMA IN SITU/ATYPICAL DUCTAL HYPERPLASIA    BREAST BIOPSY Right 09/10/2013    fibrocystic change and pseudoangiomatous stromal   BREAST EXCISIONAL BIOPSY Right 10/14/2013   breast ca right    BREAST LUMPECTOMY Right 2015   LCIS   BREAST SURGERY Right 10/14/13   lumpectomy   ENDOMETRIAL ABLATION     ingrown toe nail  2012   NASAL SINUS SURGERY  11/2011   OTHER SURGICAL HISTORY     color guard done 2018   TUBAL LIGATION  1994   Dr.[place]     OB History     Gravida  1   Para  1   Term  1   Preterm      AB      Living  1      SAB      IAB      Ectopic      Multiple      Live Births  1        Obstetric Comments  1st Menstrual Cycle:  15 1st Pregnancy:  22          Home Medications    Prior to Admission medications   Medication Sig Start Date End Date Taking? Authorizing Provider  busPIRone  (BUSPAR ) 5 MG tablet Take 1 tablet (5 mg total) by mouth 2 (two) times daily. 03/12/23  Yes Simmons-Robinson, Makiera, MD  citalopram  (CELEXA ) 40 MG tablet Take 1 tablet (40 mg total) by mouth daily. 03/12/23  Yes Simmons-Robinson, Makiera, MD  hydrochlorothiazide  (HYDRODIURIL ) 25 MG tablet Take 25  mg by mouth daily.   Yes [provider]  hydrOXYzine  (ATARAX ) 25 MG tablet Take 1 tablet (25 mg total) by mouth every 6 (six) hours. 10/05/23  Yes Parthena Fergeson R, NP  predniSONE  (STERAPRED UNI-PAK 21 TAB) 10 MG (21) TBPK tablet Take by mouth daily. Take 6 tabs by mouth daily  for 1 days, then 5 tabs for 1 days, then 4 tabs for 1 days, then 3 tabs for 1 days, 2 tabs for 1 days, then 1 tab by mouth daily for 1 days 10/05/23  Yes Lakeyta Vandenheuvel R, NP  valsartan  (DIOVAN ) 40 MG tablet Take 1 tablet (40 mg total) by mouth daily. 03/12/23  Yes Simmons-Robinson, Makiera, MD  Biotin 89999 MCG TABS Take 1 tablet by mouth daily.    [provider]  cloNIDine  (CATAPRES ) 0.1 MG tablet Take 1 tablet (0.1 mg total) by mouth daily. 03/12/23   Simmons-Robinson, Makiera, MD  estradiol  (ESTRACE ) 0.1 MG/GM vaginal cream 500 MG DAILY AT BEDTIME INTRAVAGINALLY FOR  2 WEEKS, THEN 500 MG TWO TO THREE TIMES PER WEEK 08/05/22   Drubel, Manuelita, [name]  gabapentin  (NEURONTIN ) 300 MG capsule TAKE 1 CAPSULE BY MOUTH THREE TIMES A DAY    [provider]  loratadine (CLARITIN) 10 MG tablet Take 10 mg by mouth daily.    [provider]  triamcinolone (NASACORT) 55 MCG/ACT AERO nasal inhaler Place 2 sprays into the nose daily.    [provider]    Family History Family History  Problem Relation Age of Onset   Hypertension Mother    COPD Father    Hypertension Brother    Cancer Neg Hx    Breast cancer Neg Hx     Social History Social History   Tobacco Use   Smoking status: Never   Smokeless tobacco: Never  Vaping Use   Vaping status: Never Used  Substance Use Topics   Alcohol use: Yes    Alcohol/week: 6.0 standard drinks of alcohol    Types: 6 Glasses of wine per week   Drug use: No     Allergies   Iodides, Povidone iodine, Povidone-iodine, and Sulfa antibiotics   Review of Systems Review of Systems   Physical Exam Triage Vital Signs ED Triage Vitals  Encounter Vitals Group     BP 10/05/23 1317 (!) 150/92     Girls Systolic BP Percentile --      Girls Diastolic BP Percentile --      Boys Systolic BP Percentile --      Boys Diastolic BP Percentile --      Pulse Rate 10/05/23 1317 70     Resp 10/05/23 1317 18     Temp 10/05/23 1317 98.6 F (37 C)     Temp Source 10/05/23 1317 Oral     SpO2 10/05/23 1317 97 %     Weight --      Height --      Head Circumference --      Peak Flow --      Pain Score 10/05/23 1315 3     Pain Loc --      Pain Education --      Exclude from Growth Chart --    No data found.  Updated Vital Signs BP (!) 150/92 (BP Location: Left Arm)   Pulse 70   Temp 98.6 F (37 C) (Oral)   Resp 18   SpO2 97%   Visual Acuity Right Eye Distance:   Left Eye Distance:  Bilateral Distance:    Right Eye Near:   Left Eye Near:    Bilateral Near:     Physical Exam Constitutional:       Appearance: Normal appearance.  Eyes:     Extraocular Movements: Extraocular movements intact.  Neurological:     Mental Status: She is alert.      UC Treatments / Results  Labs (all labs ordered are listed, but only abnormal results are displayed) Labs Reviewed - No data to display  EKG   Radiology No results found.  Procedures Procedures (including critical care time)  Medications Ordered in UC Medications  methylPREDNISolone  acetate (DEPO-MEDROL ) injection 60 mg (60 mg Intramuscular Given 10/05/23 1338)    Initial Impression / Assessment and Plan / UC Course  I have reviewed the triage vital signs and the nursing notes.  Pertinent labs & imaging results that were available during my care of the patient were reviewed by me and considered in my medical decision making (see chart for details).  Rash, fire ant bites, initial encounter  Appears to be localized reaction, no systemic symptoms, stable, at this time no signs of infection methylprednisolone  IM given and prescribed oral prednisone  and hydroxyzine  for home use, discussed administration, recommended over-the-counter nonpharmacological management of pruritus and advised on exposure to heat, may follow-up if symptoms continue to persist or worsen Final Clinical Impressions(s) / UC Diagnoses   Final diagnoses:  Rash and nonspecific skin eruption  Fire ant bite, accidental or unintentional, initial encounter     Discharge Instructions      Today you are being treated for the the rash to your hand which appears to be a localized reaction to ant bites  You have been given an injection of steroids today in the office today to help reduce the inflammatory process that occurs with this rash which will help minimize your itching as well as begin to clear  Starting tomorrow take prednisone  every morning with food as directed, to continue the above process  Use of oral Benadryl and begin use of hydroxyzine  using  every 6 hours as needed for management of itching, may continue use of topical Benadryl, calamine lotion and oatmeal based products  Please avoid long exposures to heat such as a hot steamy shower or being outside as this may cause further irritation to your rash  You may follow-up with his urgent care as needed if symptoms persist or worsen    ED Prescriptions     Medication Sig Dispense Auth. Provider   predniSONE  (STERAPRED UNI-PAK 21 TAB) 10 MG (21) TBPK tablet Take by mouth daily. Take 6 tabs by mouth daily  for 1 days, then 5 tabs for 1 days, then 4 tabs for 1 days, then 3 tabs for 1 days, 2 tabs for 1 days, then 1 tab by mouth daily for 1 days 21 tablet Raphaela Cannaday R, NP   hydrOXYzine  (ATARAX ) 25 MG tablet Take 1 tablet (25 mg total) by mouth every 6 (six) hours. 12 tablet Marilou Barnfield R, NP      PDMP not reviewed this encounter.   Teresa Shelba SAUNDERS, NP 10/05/23 1424

## 2023-10-05 NOTE — ED Triage Notes (Signed)
 Patient states that she was bot by fire ants on her right hand yesterday. Patient has viable blisters on hand. Patient states that areas hurt to the touch. Patient hs ben using benadryl cream and benadryl by mouth every 4 hours. 50 mg

## 2023-10-05 NOTE — Discharge Instructions (Addendum)
 Today you are being treated for the the rash to your hand which appears to be a localized reaction to ant bites  You have been given an injection of steroids today in the office today to help reduce the inflammatory process that occurs with this rash which will help minimize your itching as well as begin to clear  Starting tomorrow take prednisone  every morning with food as directed, to continue the above process  Use of oral Benadryl and begin use of hydroxyzine  using every 6 hours as needed for management of itching, may continue use of topical Benadryl, calamine lotion and oatmeal based products  Please avoid long exposures to heat such as a hot steamy shower or being outside as this may cause further irritation to your rash  You may follow-up with his urgent care as needed if symptoms persist or worsen

## 2023-10-07 ENCOUNTER — Other Ambulatory Visit: Payer: Self-pay

## 2023-10-07 ENCOUNTER — Encounter: Payer: Self-pay | Admitting: Family Medicine

## 2023-10-07 DIAGNOSIS — N9419 Other specified dyspareunia: Secondary | ICD-10-CM

## 2023-10-07 DIAGNOSIS — N951 Menopausal and female climacteric states: Secondary | ICD-10-CM

## 2023-10-07 MED ORDER — ESTRADIOL 0.1 MG/GM VA CREA: TOPICAL_CREAM | VAGINAL | 3 refills | Status: DC

## 2023-10-07 NOTE — Telephone Encounter (Signed)
 LOV 05/09/23 NOV none LRF 08/05/23 LABS 04/11/23

## 2023-10-14 ENCOUNTER — Encounter: Payer: Self-pay | Admitting: Family Medicine

## 2023-10-14 ENCOUNTER — Other Ambulatory Visit: Payer: Self-pay | Admitting: Family Medicine

## 2023-10-14 DIAGNOSIS — F4329 Adjustment disorder with other symptoms: Secondary | ICD-10-CM

## 2023-10-14 DIAGNOSIS — I10 Essential (primary) hypertension: Secondary | ICD-10-CM

## 2023-11-17 ENCOUNTER — Other Ambulatory Visit: Payer: Self-pay | Admitting: Family Medicine

## 2023-11-17 DIAGNOSIS — R232 Flushing: Secondary | ICD-10-CM

## 2023-12-25 ENCOUNTER — Other Ambulatory Visit: Payer: Self-pay | Admitting: Family Medicine

## 2023-12-25 DIAGNOSIS — F4329 Adjustment disorder with other symptoms: Secondary | ICD-10-CM

## 2024-01-13 ENCOUNTER — Other Ambulatory Visit: Payer: Self-pay | Admitting: Family Medicine

## 2024-01-13 DIAGNOSIS — F4329 Adjustment disorder with other symptoms: Secondary | ICD-10-CM

## 2024-02-09 ENCOUNTER — Telehealth: Payer: Self-pay | Admitting: Family Medicine

## 2024-02-09 DIAGNOSIS — N952 Postmenopausal atrophic vaginitis: Secondary | ICD-10-CM

## 2024-02-09 MED ORDER — ESTRADIOL 0.01 % VA CREA: 1.0000 | TOPICAL_CREAM | Freq: Every day | VAGINAL | 12 refills | Status: DC

## 2024-02-09 NOTE — Telephone Encounter (Signed)
 [facility] on [address] Has sent in a 90 day prescription request for Estradiol  0.01%. Please Advise.

## 2024-02-09 NOTE — Addendum Note (Signed)
 Addended by: SIMMONS-ROBINSON, Denilson Salminen L on: 02/09/2024 03:50 PM   Modules accepted: Orders

## 2024-02-09 NOTE — Telephone Encounter (Signed)
 Prescription sent to pharmacy.

## 2024-02-10 NOTE — Telephone Encounter (Signed)
 The patient has been notified of their newly submitted prescription

## 2024-02-10 NOTE — Telephone Encounter (Signed)
 Have left VM requesting  a call back to notify patient that med has been refilled.   Ok for [employer] RN to give message to patient

## 2024-02-24 ENCOUNTER — Other Ambulatory Visit: Payer: Self-pay | Admitting: Family Medicine

## 2024-02-24 DIAGNOSIS — I10 Essential (primary) hypertension: Secondary | ICD-10-CM

## 2024-02-24 DIAGNOSIS — F4329 Adjustment disorder with other symptoms: Secondary | ICD-10-CM

## 2024-03-05 ENCOUNTER — Other Ambulatory Visit: Payer: Self-pay | Admitting: Family Medicine

## 2024-03-05 DIAGNOSIS — N952 Postmenopausal atrophic vaginitis: Secondary | ICD-10-CM

## 2024-03-30 ENCOUNTER — Other Ambulatory Visit: Payer: Self-pay | Admitting: Family Medicine

## 2024-03-30 DIAGNOSIS — N952 Postmenopausal atrophic vaginitis: Secondary | ICD-10-CM

## 2024-04-02 ENCOUNTER — Other Ambulatory Visit: Payer: Self-pay | Admitting: Family Medicine

## 2024-04-02 DIAGNOSIS — F4329 Adjustment disorder with other symptoms: Secondary | ICD-10-CM
